# Patient Record
Sex: Male | Born: 1955 | Race: Black or African American | Hispanic: No | Marital: Married | Smoking: Former smoker
Health system: Southern US, Community
[De-identification: ages and names within clinical notes are randomized; demographics above are authoritative.]

## PROBLEM LIST (undated history)

## (undated) DIAGNOSIS — I10 Essential (primary) hypertension: Secondary | ICD-10-CM

## (undated) DIAGNOSIS — C801 Malignant (primary) neoplasm, unspecified: Secondary | ICD-10-CM

---

## 2014-08-31 DIAGNOSIS — I10 Essential (primary) hypertension: Secondary | ICD-10-CM | POA: Diagnosis present

## 2014-09-15 DIAGNOSIS — C8330 Diffuse large B-cell lymphoma, unspecified site: Secondary | ICD-10-CM | POA: Diagnosis present

## 2015-08-23 DIAGNOSIS — Z8619 Personal history of other infectious and parasitic diseases: Secondary | ICD-10-CM | POA: Insufficient documentation

## 2015-08-31 ENCOUNTER — Other Ambulatory Visit (HOSPITAL_COMMUNITY): Payer: Self-pay | Admitting: Gastroenterology

## 2015-08-31 DIAGNOSIS — B182 Chronic viral hepatitis C: Secondary | ICD-10-CM

## 2015-10-10 ENCOUNTER — Ambulatory Visit (HOSPITAL_COMMUNITY): Payer: Self-pay

## 2015-10-12 ENCOUNTER — Ambulatory Visit (HOSPITAL_COMMUNITY)
Admission: RE | Admit: 2015-10-12 | Discharge: 2015-10-12 | Disposition: A | Discharge status: Home / Self Care | Payer: 59 | Source: Ambulatory Visit | Attending: Gastroenterology | Admitting: Gastroenterology

## 2015-10-12 DIAGNOSIS — B182 Chronic viral hepatitis C: Secondary | ICD-10-CM | POA: Insufficient documentation

## 2019-03-11 ENCOUNTER — Encounter (HOSPITAL_BASED_OUTPATIENT_CLINIC_OR_DEPARTMENT_OTHER): Payer: Self-pay | Admitting: Emergency Medicine

## 2019-03-11 ENCOUNTER — Other Ambulatory Visit: Payer: Self-pay

## 2019-03-11 ENCOUNTER — Emergency Department (HOSPITAL_BASED_OUTPATIENT_CLINIC_OR_DEPARTMENT_OTHER): Payer: Medicare Other

## 2019-03-11 ENCOUNTER — Emergency Department (HOSPITAL_BASED_OUTPATIENT_CLINIC_OR_DEPARTMENT_OTHER)
Admission: EM | Admit: 2019-03-11 | Discharge: 2019-03-11 | Disposition: A | Discharge status: Home / Self Care | Payer: Medicare Other | Attending: Emergency Medicine | Admitting: Emergency Medicine

## 2019-03-11 DIAGNOSIS — I10 Essential (primary) hypertension: Secondary | ICD-10-CM | POA: Insufficient documentation

## 2019-03-11 DIAGNOSIS — F17210 Nicotine dependence, cigarettes, uncomplicated: Secondary | ICD-10-CM | POA: Diagnosis not present

## 2019-03-11 DIAGNOSIS — K279 Peptic ulcer, site unspecified, unspecified as acute or chronic, without hemorrhage or perforation: Secondary | ICD-10-CM | POA: Insufficient documentation

## 2019-03-11 DIAGNOSIS — R1012 Left upper quadrant pain: Secondary | ICD-10-CM | POA: Diagnosis present

## 2019-03-11 HISTORY — DX: Essential (primary) hypertension: I10

## 2019-03-11 HISTORY — DX: Malignant (primary) neoplasm, unspecified: C80.1

## 2019-03-11 LAB — COMPREHENSIVE METABOLIC PANEL
ALT: 16 U/L (ref 0–44)
AST: 20 U/L (ref 15–41)
Albumin: 4.1 g/dL (ref 3.5–5.0)
Alkaline Phosphatase: 53 U/L (ref 38–126)
Anion gap: 10 (ref 5–15)
BUN: 14 mg/dL (ref 8–23)
CO2: 25 mmol/L (ref 22–32)
Calcium: 9.4 mg/dL (ref 8.9–10.3)
Chloride: 103 mmol/L (ref 98–111)
Creatinine, Ser: 1.19 mg/dL (ref 0.61–1.24)
GFR calc Af Amer: >60 mL/min (ref >60)
GFR calc non Af Amer: >60 mL/min (ref >60)
Glucose, Bld: 105 mg/dL — ABNORMAL HIGH (ref 70–99)
Potassium: 3.5 mmol/L (ref 3.5–5.1)
Sodium: 138 mmol/L (ref 135–145)
Total Bilirubin: 0.4 mg/dL (ref 0.3–1.2)
Total Protein: 7.9 g/dL (ref 6.5–8.1)

## 2019-03-11 LAB — URINALYSIS, ROUTINE W REFLEX MICROSCOPIC
Bilirubin Urine: NEGATIVE
Glucose, UA: NEGATIVE mg/dL
Ketones, ur: 15 mg/dL — AB
Leukocytes,Ua: NEGATIVE
Nitrite: NEGATIVE
Protein, ur: NEGATIVE mg/dL
Specific Gravity, Urine: 1.025 (ref 1.005–1.030)
pH: 5.5 (ref 5.0–8.0)

## 2019-03-11 LAB — CBC WITH DIFFERENTIAL/PLATELET
Abs Immature Granulocytes: 0.03 10*3/uL (ref 0.00–0.07)
Basophils Absolute: 0 10*3/uL (ref 0.0–0.1)
Basophils Relative: 0 %
Eosinophils Absolute: 0.1 10*3/uL (ref 0.0–0.5)
Eosinophils Relative: 1 %
HCT: 42.3 % (ref 39.0–52.0)
Hemoglobin: 14 g/dL (ref 13.0–17.0)
Immature Granulocytes: 0 %
Lymphocytes Relative: 25 %
Lymphs Abs: 3.2 10*3/uL (ref 0.7–4.0)
MCH: 31.3 pg (ref 26.0–34.0)
MCHC: 33.1 g/dL (ref 30.0–36.0)
MCV: 94.6 fL (ref 80.0–100.0)
Monocytes Absolute: 1 10*3/uL (ref 0.1–1.0)
Monocytes Relative: 8 %
Neutro Abs: 8.5 10*3/uL — ABNORMAL HIGH (ref 1.7–7.7)
Neutrophils Relative %: 66 %
Platelets: 297 10*3/uL (ref 150–400)
RBC: 4.47 MIL/uL (ref 4.22–5.81)
RDW: 15.3 % (ref 11.5–15.5)
WBC: 12.9 10*3/uL — ABNORMAL HIGH (ref 4.0–10.5)
nRBC: 0 % (ref 0.0–0.2)

## 2019-03-11 LAB — URINALYSIS, MICROSCOPIC (REFLEX): WBC, UA: NONE SEEN WBC/hpf (ref 0–5)

## 2019-03-11 LAB — LIPASE, BLOOD: Lipase: 24 U/L (ref 11–51)

## 2019-03-11 MED ORDER — SUCRALFATE 1 G PO TABS
1.0000 g | ORAL_TABLET | Freq: Once | ORAL | Status: AC
  Administered 2019-03-11: 1 g via ORAL
  Filled 2019-03-11: qty 1

## 2019-03-11 MED ORDER — MORPHINE SULFATE (PF) 4 MG/ML IV SOLN
4.0000 mg | Freq: Once | INTRAVENOUS | Status: AC
  Administered 2019-03-11: 4 mg via INTRAVENOUS
  Filled 2019-03-11: qty 1

## 2019-03-11 MED ORDER — LIDOCAINE VISCOUS HCL 2 % MT SOLN
15.0000 mL | Freq: Once | OROMUCOSAL | Status: AC
  Administered 2019-03-11: 15 mL via ORAL
  Filled 2019-03-11: qty 15

## 2019-03-11 MED ORDER — PANTOPRAZOLE SODIUM 40 MG IV SOLR
40.0000 mg | Freq: Once | INTRAVENOUS | Status: AC
  Administered 2019-03-11: 40 mg via INTRAVENOUS
  Filled 2019-03-11: qty 40

## 2019-03-11 MED ORDER — SUCRALFATE 1 GM/10ML PO SUSP
ORAL | Status: AC
  Administered 2019-03-11: 1 g
  Filled 2019-03-11: qty 10

## 2019-03-11 MED ORDER — PANTOPRAZOLE SODIUM 20 MG PO TBEC: 20.0000 mg | DELAYED_RELEASE_TABLET | Freq: Every day | ORAL | 0 refills | Status: DC

## 2019-03-11 MED ORDER — FAMOTIDINE IN NACL 20-0.9 MG/50ML-% IV SOLN
20.0000 mg | Freq: Once | INTRAVENOUS | Status: AC
  Administered 2019-03-11: 19:00:00 20 mg via INTRAVENOUS
  Filled 2019-03-11: qty 50

## 2019-03-11 MED ORDER — ALUM & MAG HYDROXIDE-SIMETH 200-200-20 MG/5ML PO SUSP
30.0000 mL | Freq: Once | ORAL | Status: AC
  Administered 2019-03-11: 30 mL via ORAL
  Filled 2019-03-11: qty 30

## 2019-03-11 MED ORDER — IOHEXOL 300 MG/ML  SOLN
100.0000 mL | Freq: Once | INTRAMUSCULAR | Status: AC | PRN
  Administered 2019-03-11: 100 mL via INTRAVENOUS

## 2019-03-11 MED ORDER — FAMOTIDINE 20 MG PO TABS: 20.0000 mg | ORAL_TABLET | Freq: Two times a day (BID) | ORAL | 0 refills | Status: DC

## 2019-03-11 MED ORDER — SODIUM CHLORIDE 0.9 % IV BOLUS
500.0000 mL | Freq: Once | INTRAVENOUS | Status: AC
  Administered 2019-03-11: 500 mL via INTRAVENOUS

## 2019-03-11 NOTE — ED Provider Notes (Addendum)
[facility] Provider Note   CSN: [id number] Arrival date & time: 03/11/19  1550     History Chief Complaint  Patient presents with   Abdominal Pain    Terry Beltran is a 64 y.o. male with history of lymphoma in remission since 2017, hypertension otherwise healthy no daily medication use.  Patient presents today for left upper quadrant pain onset 3 weeks ago pain is intermittent worsened in the afternoon and night, nonradiating, burning in nature moderate in intensity, no alleviating factors.  He has not attempted any medication for his pain prior to arrival.  Patient reports that he had a CT scan 3 weeks ago which showed "diverticulitis".  He reports he has not started any medications after that visit.  He denies fever/chills, headache, neck pain, chest pain/shortness of breath, cough/hemoptysis, vomiting, diarrhea, dysuria/hematuria, testicular pain/swelling, fall/injury or any additional concerns. HPI     Past Medical History:  Diagnosis Date   Cancer ([place])    Hypertension     There are no problems to display for this patient.   History reviewed. No pertinent surgical history.     No family history on file.  Social History   Tobacco Use   Smoking status: Current Some Day Smoker   Smokeless tobacco: Never Used  Substance Use Topics   Alcohol use: Yes   Drug use: Never    Home Medications Prior to Admission medications   Medication Sig Start Date End Date Taking? Authorizing Provider  famotidine (PEPCID) 20 MG tablet Take 1 tablet (20 mg total) by mouth 2 (two) times daily. 03/11/19 04/10/19  Nuala Alpha A, PA-C  pantoprazole (PROTONIX) 20 MG tablet Take 1 tablet (20 mg total) by mouth daily. 03/11/19 04/10/19  Deliah Boston, PA-C    Allergies    Patient has no known allergies.  Review of Systems   Review of Systems.  Ten systems are reviewed and are negative for acute change except as noted in the  HPI  Physical Exam Updated Vital Signs BP (!) 148/84 (BP Location: Left Arm)    Pulse 84    Temp 99 F (37.2 C) (Oral)    Resp 20    Ht 6' (1.829 m)    Wt 114.3 kg    SpO2 99%    BMI 34.18 kg/m   Physical Exam Constitutional:      General: He is not in acute distress.    Appearance: Normal appearance. He is well-developed. He is not ill-appearing or diaphoretic.  HENT:     Head: Normocephalic and atraumatic.     Right Ear: External ear normal.     Left Ear: External ear normal.     Nose: Nose normal.  Eyes:     General: Vision grossly intact. Gaze aligned appropriately.     Pupils: Pupils are equal, round, and reactive to light.  Neck:     Trachea: Trachea and phonation normal. No tracheal deviation.  Cardiovascular:     Rate and Rhythm: Normal rate and regular rhythm.     Heart sounds: Normal heart sounds.  Pulmonary:     Effort: Pulmonary effort is normal. No respiratory distress.     Breath sounds: Normal breath sounds.  Abdominal:     General: There is no distension.     Palpations: Abdomen is soft.     Tenderness: There is abdominal tenderness in the left upper quadrant. There is no guarding or rebound.  Genitourinary:    Comments: Deferred by patient Musculoskeletal:  General: Normal range of motion.     Cervical back: Normal range of motion.  Skin:    General: Skin is warm and dry.  Neurological:     Mental Status: He is alert.     GCS: GCS eye subscore is 4. GCS verbal subscore is 5. GCS motor subscore is 6.     Comments: Speech is clear and goal oriented, follows commands Major Cranial nerves without deficit, no facial droop Moves extremities without ataxia, coordination intact  Psychiatric:        Behavior: Behavior normal.     ED Results / Procedures / Treatments   Labs (all labs ordered are listed, but only abnormal results are displayed) Labs Reviewed  URINALYSIS, ROUTINE W REFLEX MICROSCOPIC - Abnormal; Notable for the following components:       Result Value   Hgb urine dipstick TRACE (*)    Ketones, ur 15 (*)    All other components within normal limits  COMPREHENSIVE METABOLIC PANEL - Abnormal; Notable for the following components:   Glucose, Bld 105 (*)    All other components within normal limits  CBC WITH DIFFERENTIAL/PLATELET - Abnormal; Notable for the following components:   WBC 12.9 (*)    Neutro Abs 8.5 (*)    All other components within normal limits  URINALYSIS, MICROSCOPIC (REFLEX) - Abnormal; Notable for the following components:   Bacteria, UA RARE (*)    All other components within normal limits  LIPASE, BLOOD    EKG EKG Interpretation  Date/Time:  Thursday March 11 2019 16:33:47 EST Ventricular Rate:  74 PR Interval:  286 QRS Duration: 82 QT Interval:  420 QTC Calculation: 466 R Axis:   22 Text Interpretation: Sinus rhythm with 1st degree A-V block Nonspecific T wave abnormality Prolonged QT Abnormal ECG No old tracing to compare Confirmed by Sherwood Gambler [phone]) on 03/11/2019 6:00:14 PM   Radiology CT ABDOMEN PELVIS W CONTRAST  Result Date: 03/11/2019 CLINICAL DATA:  Diverticulitis. Left upper quadrant abdominal pain. EXAM: CT ABDOMEN AND PELVIS WITH CONTRAST TECHNIQUE: Multidetector CT imaging of the abdomen and pelvis was performed using the standard protocol following bolus administration of intravenous contrast. CONTRAST:  158mL OMNIPAQUE IOHEXOL 300 MG/ML  SOLN COMPARISON:  09/01/2014. FINDINGS: Lower chest: The lung bases are clear. The heart size is normal. Hepatobiliary: The liver is normal. Normal gallbladder.There is no biliary ductal dilation. Pancreas: Normal contours without ductal dilatation. No peripancreatic fluid collection. Spleen: No splenic laceration or hematoma. Adrenals/Urinary Tract: --Adrenal glands: No adrenal hemorrhage. --Right kidney/ureter: No hydronephrosis or perinephric hematoma. --Left kidney/ureter: No hydronephrosis or perinephric hematoma. --Urinary bladder:  Unremarkable. Stomach/Bowel: --Stomach/Duodenum: There is some wall thickening of the gastric antrum, pylorus, and proximal duodenum. There is mild adjacent fat stranding with a few prominent regional lymph nodes. --Small bowel: No dilatation or inflammation. --Colon: There are few scattered colonic diverticula without CT evidence for diverticulitis. --Appendix: Normal. Vascular/Lymphatic: Atherosclerotic calcification is present within the non-aneurysmal abdominal aorta, without hemodynamically significant stenosis. --No retroperitoneal lymphadenopathy. --No mesenteric lymphadenopathy. --No pelvic or inguinal lymphadenopathy. Reproductive: Unremarkable Other: No ascites or free air. The abdominal wall is normal. Musculoskeletal. No acute displaced fractures. IMPRESSION: 1. There is some wall thickening of the gastric antrum, pylorus, and proximal duodenum with mild adjacent fat stranding with a few prominent regional lymph nodes. Findings are suggestive of gastritis/duodenitis or peptic ulcer disease. 2. Colonic diverticulosis without diverticulitis. 3. Aortic Atherosclerosis (ICD10-I70.0). Electronically Signed   By: Constance Holster M.D.   On: 03/11/2019 17:54  Procedures Procedures (including critical care time)  Medications Ordered in ED Medications  alum & mag hydroxide-simeth (MAALOX/MYLANTA) 200-200-20 MG/5ML suspension 30 mL (30 mLs Oral Given 03/11/19 1644)    And  lidocaine (XYLOCAINE) 2 % viscous mouth solution 15 mL (15 mLs Oral Given 03/11/19 1645)  sodium chloride 0.9 % bolus 500 mL (0 mLs Intravenous Stopped 03/11/19 1746)  iohexol (OMNIPAQUE) 300 MG/ML solution 100 mL (100 mLs Intravenous Contrast Given 03/11/19 1719)  morphine 4 MG/ML injection 4 mg (4 mg Intravenous Given 03/11/19 1751)  sucralfate (CARAFATE) tablet 1 g (1 g Oral Given 03/11/19 1825)  sucralfate (CARAFATE) 1 GM/10ML suspension (1 g  Given 03/11/19 1824)  pantoprazole (PROTONIX) injection 40 mg (40 mg Intravenous Given  03/11/19 1845)  famotidine (PEPCID) IVPB 20 mg premix (0 mg Intravenous Stopped 03/11/19 1854)    ED Course  I have reviewed the triage vital signs and the nursing notes.  Pertinent labs & imaging results that were available during my care of the patient were reviewed by me and considered in my medical decision making (see chart for details).    MDM Rules/Calculators/A&P                     63 year old male with history as detailed above presents today for left upper quadrant pain that has been ongoing for the past 3 weeks it is waxing and waning worsened in the afternoon and nighttime, has not attempted any medication prior to arrival for his symptoms.  He denies any infectious-like symptoms nausea, vomiting, diarrhea and denies chest pain shortness of breath or cough.  He is mildly tender in the left upper quadrant without peritoneal signs.  No sign of injury.  Will obtain abdominal lab work and CT abdomen pelvis, patient reports a history of diverticulitis diagnosed 3 weeks ago at [facility] but was not started on any antibiotics.  I am unable to access their images through chart review he cannot review that visit.  My suspicion for diverticulitis is low at this time however patient reports that he was diagnosed diverticulitis so feel CT scan is needed at this time, will treat for likely reflux symptoms. - CBC shows mild excesses of 12.9 with left shift, likely secondary to patient's pain Lipase within normal limits no evidence of pancreatitis CMP with glucose 105 otherwise within normal limits no acute electrolyte abnormalities, kidney injury or elevation of LFTs Urinalysis shows trace hemoglobin 15 ketones possibly secondary to dehydration no evidence of infection  EKG: Sinus rhythm with 1st degree A-V block Nonspecific T wave abnormality Prolonged QT Abnormal ECG No old tracing to compare Confirmed by Sherwood Gambler [phone]) on 03/11/2019 6:00:14 PM Patient without chest pain or  shortness of breath, no evidence of acute ischemic changes.  Patient will follow up with his family doctor for incidental findings on EKG.  Discussed with Dr. Regenia Skeeter who agrees.  CT AP:  IMPRESSION:  1. There is some wall thickening of the gastric antrum, pylorus, and  proximal duodenum with mild adjacent fat stranding with a few  prominent regional lymph nodes. Findings are suggestive of  gastritis/duodenitis or peptic ulcer disease.  2. Colonic diverticulosis without diverticulitis.  3. Aortic Atherosclerosis (ICD10-I70.0).  - Suspect patient symptoms today secondary to likely PUD, he has no evidence of perforation today.  He was given GI cocktail, Carafate and morphine.  Pain resolved.  He was started on Protonix and Pepcid.  He will be continued on PPI and H2 blocker and referred  to gastroenterology for follow-up visit.  On reexamination he is well-appearing no acute distress.  He states understanding of findings as above and has no further questions. - 7:06 PM: Again reevaluated resting comfortably no acute distress he has received Pepcid and Protonix and is requesting discharge.  I discussed recommendations including to stop using NSAIDs and alcohol and he states understanding.  He plans to call gastroenterologist tomorrow.  At this time there does not appear to be any evidence of an acute emergency medical condition and the patient appears stable for discharge with appropriate outpatient follow up. Diagnosis was discussed with patient who verbalizes understanding of care plan and is agreeable to discharge. I have discussed return precautions with patient who verbalizes understanding of return precautions. Patient encouraged to follow-up with their PCP and GI. All questions answered.  Patient's seen and evaluated by Dr. Regenia Skeeter during this visit who agrees with discharge and outpatient follow-up.  Note: Portions of this report may have been transcribed using voice recognition software.  Every effort was made to ensure accuracy; however, inadvertent computerized transcription errors may still be present. Final Clinical Impression(s) / ED Diagnoses Final diagnoses:  PUD (peptic ulcer disease)  Left upper quadrant abdominal pain    Rx / DC Orders ED Discharge Orders         Ordered    pantoprazole (PROTONIX) 20 MG tablet  Daily     03/11/19 1901    famotidine (PEPCID) 20 MG tablet  2 times daily     03/11/19 1901           Gari Crown 03/11/19 1904    Gari Crown 03/11/19 Glenetta Hew, MD 03/12/19 [phone]

## 2019-03-11 NOTE — ED Triage Notes (Signed)
LUQ pain x 3 weeks, worse in the afternoon. Has had a CT scan which was neg.

## 2019-03-11 NOTE — Discharge Instructions (Addendum)
You have been diagnosed today with left upper quadrant pain.  At this time there does not appear to be the presence of an emergent medical condition, however there is always the potential for conditions to change. Please read and follow the below instructions.  Please return to the Emergency Department immediately for any new or worsening symptoms. Please be sure to follow up with your Primary Care Provider within one week regarding your visit today; please call their office to schedule an appointment even if you are feeling better for a follow-up visit. Please take the medications Protonix and Pepcid as prescribed to help with your abdominal pain.  Please call the gastroenterologist Dr. Collene Mares on your discharge paperwork to schedule a follow-up visit for further evaluation of your abdominal pain. Your CT scan today showed diverticulosis and aortic atherosclerosis, please discuss these incidental findings with your primary care provider and the gastroenterologist at your follow-up visits. Your EKG today showed first-degree AV block and nonspecific T wave abnormalities and prolonged QT.  Please discuss these incidental findings with your primary care provider at your follow-up visit this week.  Get help right away if: You have sudden, sharp pain in your belly (abdomen). You have belly pain that does not go away. You have bloody poop (stool) or black, tarry poop. You throw up blood. It may look like coffee grounds. You feel light-headed or feel like you may pass out (faint). You get weak. You get sweaty or feel sticky and cold to the touch (clammy). You have any new/concerning or worsening of symptoms  Please read the additional information packets attached to your discharge summary.  Do not take your medicine if  develop an itchy rash, swelling in your mouth or lips, or difficulty breathing; call 911 and seek immediate emergency medical attention if this occurs.  Note: Portions of this text may  have been transcribed using voice recognition software. Every effort was made to ensure accuracy; however, inadvertent computerized transcription errors may still be present.

## 2019-07-12 DIAGNOSIS — E782 Mixed hyperlipidemia: Secondary | ICD-10-CM | POA: Diagnosis present

## 2021-02-11 ENCOUNTER — Other Ambulatory Visit: Payer: Self-pay

## 2021-02-11 ENCOUNTER — Encounter (HOSPITAL_BASED_OUTPATIENT_CLINIC_OR_DEPARTMENT_OTHER): Payer: Self-pay

## 2021-02-11 ENCOUNTER — Emergency Department (HOSPITAL_BASED_OUTPATIENT_CLINIC_OR_DEPARTMENT_OTHER)
Admission: EM | Admit: 2021-02-11 | Discharge: 2021-02-11 | Disposition: A | Discharge status: Home / Self Care | Payer: Medicare Other | Attending: Emergency Medicine | Admitting: Emergency Medicine

## 2021-02-11 ENCOUNTER — Emergency Department (HOSPITAL_BASED_OUTPATIENT_CLINIC_OR_DEPARTMENT_OTHER): Payer: Medicare Other

## 2021-02-11 DIAGNOSIS — R1032 Left lower quadrant pain: Secondary | ICD-10-CM | POA: Diagnosis present

## 2021-02-11 DIAGNOSIS — I10 Essential (primary) hypertension: Secondary | ICD-10-CM | POA: Insufficient documentation

## 2021-02-11 DIAGNOSIS — R112 Nausea with vomiting, unspecified: Secondary | ICD-10-CM | POA: Diagnosis not present

## 2021-02-11 DIAGNOSIS — Z20822 Contact with and (suspected) exposure to covid-19: Secondary | ICD-10-CM | POA: Insufficient documentation

## 2021-02-11 DIAGNOSIS — R0602 Shortness of breath: Secondary | ICD-10-CM | POA: Insufficient documentation

## 2021-02-11 LAB — CBC
HCT: 45.7 % (ref 39.0–52.0)
Hemoglobin: 15.4 g/dL (ref 13.0–17.0)
MCH: 31.7 pg (ref 26.0–34.0)
MCHC: 33.7 g/dL (ref 30.0–36.0)
MCV: 94 fL (ref 80.0–100.0)
Platelets: 336 10*3/uL (ref 150–400)
RBC: 4.86 MIL/uL (ref 4.22–5.81)
RDW: 14 % (ref 11.5–15.5)
WBC: 14.6 10*3/uL — ABNORMAL HIGH (ref 4.0–10.5)
nRBC: 0 % (ref 0.0–0.2)

## 2021-02-11 LAB — COMPREHENSIVE METABOLIC PANEL
ALT: 35 U/L (ref 0–44)
AST: 32 U/L (ref 15–41)
Albumin: 4.3 g/dL (ref 3.5–5.0)
Alkaline Phosphatase: 68 U/L (ref 38–126)
Anion gap: 10 (ref 5–15)
BUN: 17 mg/dL (ref 8–23)
CO2: 27 mmol/L (ref 22–32)
Calcium: 10.3 mg/dL (ref 8.9–10.3)
Chloride: 99 mmol/L (ref 98–111)
Creatinine, Ser: 1.17 mg/dL (ref 0.61–1.24)
GFR, Estimated: >60 mL/min (ref >60)
Glucose, Bld: 119 mg/dL — ABNORMAL HIGH (ref 70–99)
Potassium: 3.5 mmol/L (ref 3.5–5.1)
Sodium: 136 mmol/L (ref 135–145)
Total Bilirubin: 0.9 mg/dL (ref 0.3–1.2)
Total Protein: 8.5 g/dL — ABNORMAL HIGH (ref 6.5–8.1)

## 2021-02-11 LAB — RESP PANEL BY RT-PCR (FLU A&B, COVID) ARPGX2
Influenza A by PCR: NEGATIVE
Influenza B by PCR: NEGATIVE
SARS Coronavirus 2 by RT PCR: NEGATIVE

## 2021-02-11 LAB — LIPASE, BLOOD: Lipase: 32 U/L (ref 11–51)

## 2021-02-11 LAB — TROPONIN I (HIGH SENSITIVITY)
Troponin I (High Sensitivity): 21 ng/L — ABNORMAL HIGH (ref <18)
Troponin I (High Sensitivity): 26 ng/L — ABNORMAL HIGH (ref <18)

## 2021-02-11 MED ORDER — PANTOPRAZOLE SODIUM 20 MG PO TBEC
20.0000 mg | DELAYED_RELEASE_TABLET | Freq: Once | ORAL | Status: DC
Start: 2021-02-11 — End: 2021-02-11

## 2021-02-11 MED ORDER — PANTOPRAZOLE SODIUM 20 MG PO TBEC: 20.0000 mg | DELAYED_RELEASE_TABLET | Freq: Every day | ORAL | 0 refills | Status: DC

## 2021-02-11 MED ORDER — SODIUM CHLORIDE 0.9 % IV BOLUS
1000.0000 mL | Freq: Once | INTRAVENOUS | Status: AC
  Administered 2021-02-11: 1000 mL via INTRAVENOUS

## 2021-02-11 MED ORDER — SUCRALFATE 1 G PO TABS
1.0000 g | ORAL_TABLET | Freq: Three times a day (TID) | ORAL | 0 refills | Status: DC
Start: 2021-02-11 — End: 2022-02-21

## 2021-02-11 MED ORDER — IOHEXOL 300 MG/ML  SOLN
100.0000 mL | Freq: Once | INTRAMUSCULAR | Status: AC | PRN
  Administered 2021-02-11: 100 mL via INTRAVENOUS

## 2021-02-11 MED ORDER — MORPHINE SULFATE (PF) 4 MG/ML IV SOLN
4.0000 mg | Freq: Once | INTRAVENOUS | Status: AC
  Administered 2021-02-11: 4 mg via INTRAVENOUS
  Filled 2021-02-11: qty 1

## 2021-02-11 MED ORDER — SUCRALFATE 1 G PO TABS: 1.0000 g | ORAL_TABLET | Freq: Three times a day (TID) | ORAL | 0 refills | Status: DC

## 2021-02-11 NOTE — ED Notes (Signed)
Patient transported to CT 

## 2021-02-11 NOTE — ED Notes (Signed)
Attempted IV x 2, to Bil AC's. Tol well, blood obtained for labs

## 2021-02-11 NOTE — Discharge Instructions (Addendum)
He was seen in the emergency department today for abdominal pain.  He had a CT of your abdomen which showed that you had some irritation of your stomach.  We are getting give you 2 medications.  1 is Carafate that you will take 4 times a day.  The other is Protonix that you will take once a day.  Please follow-up with your primary care provider.  Please return to the emergency department if you have chest pain, worsening shortness of breath, worsening abdominal pain, distention, nausea or vomiting that does not respond.  Unable to eat or drink.

## 2021-02-11 NOTE — ED Provider Notes (Signed)
Shared visit.  Patient comes to the ED with abdominal pain.  Normal vitals.  No fever.  History of diffuse B cell lymphoma in remission.  Also history of hypertension.  History of gastritis and peptic ulcer disease.  Patient has been having left lower abdominal pressure and discomfort for about a week with nausea and vomiting and decreased appetite.  Denies any chest pain.  Had some shortness of breath earlier today but none currently.  Patient denies any black or bloody stools.  Denies any constipation.  He has not had any exertional chest pain or shortness of breath.  Concern for possible diverticulitis per patient.  EKG done in triage shows sinus rhythm with right and left bundle branch.  No ischemic changes.  Differential diagnosis includes diverticulitis versus gastritis versus bowel obstruction.  He is not having any urinary symptoms.  Left-sided pressure he describes and will get troponin to also evaluate for acute coronary syndrome.  Although this is thought to be less likely due to atypical story.  He is not having any active shortness of breath.  No hypoxia or tachycardia and have no suspicion for pulmonary embolism.  He does have a smoking history.  Do not hear any wheezing.  Will obtain a chest x-ray to evaluate for pneumonia.  Per my interpretation chest x-ray has no evidence of pneumonia or pneumothorax.  CT scan per radiology read shows wall thickening of the stomach suggestive of gastritis.  Otherwise there is no diverticulitis or bowel obstruction or other acute intra-abdominal process.  Patient had troponin of 21 and 26.  Overall no concern for acute coronary syndrome.  No chest pain or shortness of breath.  Patient had no significant leukocytosis, anemia, electrolyte abnormality.  COVID and flu test were negative.  Lipase normal.  Overall suspect gastritis causing the symptoms/gas related pain.  Will treat with Protonix and Carafate.  Told to return to the ED if he develops worsening pain  including worsening chest pain/shortness of breath.  Discharged in good condition.  Recommend follow-up with primary care doctor.  This chart was dictated using voice recognition software.  Despite best efforts to proofread,  errors can occur which can change the documentation meaning.    Lennice Sites, DO 02/11/21 1419

## 2021-02-11 NOTE — ED Notes (Signed)
Pt states unable to provide urine specimen at this time, given water and getting IV fluids

## 2021-02-11 NOTE — ED Triage Notes (Signed)
Pt c/o left abdominal pressure x 1 week with nausea, vomiting & decreased appetite. Last BM this morning. Shortness of breath with ambulation.

## 2021-02-11 NOTE — ED Provider Notes (Signed)
[facility] Provider Note   CSN: [id number] Arrival date & time: 02/11/21  1048     History  Chief Complaint  Patient presents with   Abdominal Pain    Terry Beltran is a 66 y.o. male.  With past medical history of hypertension, DLBCL now in remission who presents to the emergency department with abdominal pain.  Patient states that for 1 week he has had dull left lower quadrant abdominal pain that he described as intermittent and pressure.  He has had associated nausea and vomiting, last episode yesterday.  It is nonbloody vomit.  He has had normal bowel movements, the last one being this morning.  He denies any dark tarry stools or hematochezia.  He denies any fevers, decreased appetite, urinary symptoms, testicular pain or swelling, weight loss, feeling bloated, excess gas or indigestion.  Additionally he states that this morning coming to the emergency department he felt increasingly short of breath.  He denies any chest pain, palpitations, lower extremity edema, recent travel, unilateral lower extremity swelling, history of DVT or PE.  He does smoke.   Abdominal Pain Associated symptoms: nausea, shortness of breath and vomiting   Associated symptoms: no chest pain, no constipation, no cough, no diarrhea, no dysuria and no fever       Home Medications Prior to Admission medications   Medication Sig Start Date End Date Taking? Authorizing Provider  famotidine (PEPCID) 20 MG tablet Take 1 tablet (20 mg total) by mouth 2 (two) times daily. 03/11/19 04/10/19  Nuala Alpha A, PA-C  pantoprazole (PROTONIX) 20 MG tablet Take 1 tablet (20 mg total) by mouth daily. 03/11/19 04/10/19  Deliah Boston, PA-C      Allergies    Patient has no known allergies.    Review of Systems   Review of Systems  Constitutional:  Negative for fever.  Respiratory:  Positive for shortness of breath. Negative for cough.   Cardiovascular:  Negative for chest pain,  palpitations and leg swelling.  Gastrointestinal:  Positive for abdominal pain, nausea and vomiting. Negative for abdominal distention, constipation and diarrhea.  Genitourinary:  Negative for dysuria and testicular pain.  All other systems reviewed and are negative.  Physical Exam Updated Vital Signs BP (!) 155/102 (BP Location: Left Arm)    Pulse 94    Temp 98.2 F (36.8 C) (Oral)    Resp 18    Ht 6' (1.829 m)    Wt 114.8 kg    SpO2 98%    BMI 34.33 kg/m  Physical Exam Vitals and nursing note reviewed.  Constitutional:      General: He is not in acute distress.    Appearance: Normal appearance. He is well-developed. He is not ill-appearing or toxic-appearing.  HENT:     Head: Normocephalic and atraumatic.     Mouth/Throat:     Mouth: Mucous membranes are moist.     Pharynx: Oropharynx is clear.  Eyes:     General: No scleral icterus.    Extraocular Movements: Extraocular movements intact.     Pupils: Pupils are equal, round, and reactive to light.  Cardiovascular:     Rate and Rhythm: Normal rate and regular rhythm.     Heart sounds: Normal heart sounds. No murmur heard. Pulmonary:     Effort: Pulmonary effort is normal. No respiratory distress.     Breath sounds: Normal breath sounds.  Abdominal:     General: Abdomen is protuberant. Bowel sounds are normal. There is no distension.  Palpations: Abdomen is soft.     Tenderness: There is abdominal tenderness in the left lower quadrant. There is no right CVA tenderness or left CVA tenderness. Negative signs include Murphy's sign.     Hernia: No hernia is present.  Musculoskeletal:        General: Normal range of motion.     Cervical back: Normal range of motion.  Skin:    General: Skin is warm and dry.     Capillary Refill: Capillary refill takes less than 2 seconds.  Neurological:     General: No focal deficit present.     Mental Status: He is alert and oriented to person, place, and time. Mental status is at baseline.   Psychiatric:        Mood and Affect: Mood normal.        Behavior: Behavior normal.        Thought Content: Thought content normal.        Judgment: Judgment normal.    ED Results / Procedures / Treatments   Labs (all labs ordered are listed, but only abnormal results are displayed) Labs Reviewed  CBC - Abnormal; Notable for the following components:      Result Value   WBC 14.6 (*)    All other components within normal limits  COMPREHENSIVE METABOLIC PANEL - Abnormal; Notable for the following components:   Glucose, Bld 119 (*)    Total Protein 8.5 (*)    All other components within normal limits  TROPONIN I (HIGH SENSITIVITY) - Abnormal; Notable for the following components:   Troponin I (High Sensitivity) 21 (*)    All other components within normal limits  TROPONIN I (HIGH SENSITIVITY) - Abnormal; Notable for the following components:   Troponin I (High Sensitivity) 26 (*)    All other components within normal limits  RESP PANEL BY RT-PCR (FLU A&B, COVID) ARPGX2  LIPASE, BLOOD  URINALYSIS, ROUTINE W REFLEX MICROSCOPIC   EKG EKG Interpretation  Date/Time:  Sunday February 11 2021 13:30:16 EST Ventricular Rate:  80 PR Interval:  269 QRS Duration: 141 QT Interval:  415 QTC Calculation: 479 R Axis:   -29 Text Interpretation: Sinus rhythm Prolonged PR interval Right bundle branch block Confirmed by Lennice Sites ([id number]) on 02/11/2021 1:34:12 PM  Radiology DG Chest 2 View  Result Date: 02/11/2021 CLINICAL DATA:  Pt complains of abdominal pain on his left side. Sob and lightheadedness on exertion. Smoker. HTN. No known heart or lung conditions. Hx of cancer. EXAM: CHEST - 2 VIEW COMPARISON:  07/08/2016. FINDINGS: Cardiac silhouette is normal in size. No mediastinal or hilar masses. No evidence of adenopathy. Clear lungs.  No pleural effusion or pneumothorax. Skeletal structures are intact. IMPRESSION: No active cardiopulmonary disease. Electronically Signed   By: Lajean Manes  M.D.   On: 02/11/2021 11:41   CT Abdomen Pelvis W Contrast  Result Date: 02/11/2021 CLINICAL DATA:  Left abdominal pressure for 1 week with nausea and vomiting. Decreased appetite. Short of breath with ambulation. EXAM: CT ABDOMEN AND PELVIS WITH CONTRAST TECHNIQUE: Multidetector CT imaging of the abdomen and pelvis was performed using the standard protocol following bolus administration of intravenous contrast. RADIATION DOSE REDUCTION: This exam was performed according to the departmental dose-optimization program which includes automated exposure control, adjustment of the mA and/or kV according to patient size and/or use of iterative reconstruction technique. CONTRAST:  143mL OMNIPAQUE IOHEXOL 300 MG/ML  SOLN COMPARISON:  03/11/2019. FINDINGS: Lower chest: No acute abnormality. Hepatobiliary: No focal liver abnormality  is seen. No gallstones, gallbladder wall thickening, or biliary dilatation. Pancreas: Unremarkable. No pancreatic ductal dilatation or surrounding inflammatory changes. Spleen: Normal in size without focal abnormality. Adrenals/Urinary Tract: Mild stable nodular thickening of the left adrenal gland consistent with hyperplasia similar to the prior CT. Normal right adrenal gland. Kidneys normal size, orientation and position with symmetric enhancement and excretion. Subcentimeter low-density renal lesions, lateral cortex, lower pole of the right kidney and posterior upper pole the left kidney, stable consistent with cysts. No other masses, no stones and no hydronephrosis. Normal ureters. Normal bladder. Stomach/Bowel: Apparent thickening of the wall of the stomach, along its posterior margin and lesser sac. Stomach otherwise unremarkable. Colon and small bowel are normal in caliber. No wall thickening. No inflammation. Normal appendix visualized. Vascular/Lymphatic: Prominent periceliac lymph nodes, largest 1.1 cm short axis. No other adenopathy. Minor aortic atherosclerosis. No aneurysm.  Reproductive: Unremarkable. Other: Minimal fat containing umbilical hernia.  No ascites. Musculoskeletal: No fracture or acute finding.  No bone lesion. IMPRESSION: 1. Apparent wall thickening along the posterior margin of the stomach and lesser sac suggesting gastritis. This is an equivocal finding. There are prominent periceliac lymph nodes, presumed reactive and stable from the prior CT. 2. No other evidence of an acute abnormality within the abdomen or pelvis. No findings to account for left lower quadrant abdominal pain. 3. Minor aortic atherosclerosis. Electronically Signed   By: Lajean Manes M.D.   On: 02/11/2021 12:38    Procedures Procedures   Medications Ordered in ED Medications  morphine 4 MG/ML injection 4 mg (4 mg Intravenous Given 02/11/21 1206)  iohexol (OMNIPAQUE) 300 MG/ML solution 100 mL (100 mLs Intravenous Contrast Given 02/11/21 1210)  sodium chloride 0.9 % bolus 1,000 mL (0 mLs Intravenous Stopped 02/11/21 1419)   ED Course/ Medical Decision Making/ A&P                           Medical Decision Making Amount and/or Complexity of Data Reviewed Labs: ordered. Radiology: ordered.  Risk Prescription drug management.  Patient presents to the ED with complaints of abdominal pain. This involves an extensive number of treatment options, and is a complaint that carries with it a high risk of complications and morbidity.   Additional history obtained:  Additional history obtained from: External records from outside source obtained and reviewed including: Previous oncology and primary care visits  EKG: Sinus rhythm, right bundle branch block, prolonged PR interval  Cardiac Monitoring: The patient was maintained on a cardiac monitor.  I personally viewed and interpreted the cardiac monitored which showed an underlying rhythm of: Sinus rhythm  Lab Results: I Ordered, reviewed, and interpreted labs. Pertinent results include: CBC with leukocytosis to 14.6, likely  reactive CMP within normal limits Lipase 32 COVID and flu negative Initial troponin 21, repeat 26, delta +5  Imaging Studies ordered:  I ordered imaging studies which included chest x-ray, CT abdomen pelvis with contrast.  I independently reviewed & interpreted imaging & am in agreement with radiology impression. Imaging shows: Chest x-ray which demonstrates no active cardiopulmonary disease CT abdomen pelvis which shows apparent wall thickening along the posterior margin of the stomach and lesser sac suggesting gastritis.  No other abnormalities.  Medications  I ordered medication including IV fluids for nausea and vomiting, morphine for pain  Reevaluation of the patient after medication shows that patient improved  Tests Considered: D-dimer  ED Course: 66 year old male who presents emergency department with 1 week of left  lower quadrant abdominal pain associated with nausea and vomiting.  Additionally had an episode of shortness of breath when he presented to the emergency department which has since resolved. He does have leukocytosis to 14.6 which I think is likely reactive.  He has been afebrile, no tachycardia or other evidence of acute infections at this time.  He does have gastritis as seen on CT however there are no other acute abnormalities found on imaging.  Due to this, doubt acute diverticulitis, appendicitis, bowel obstruction, mesenteric ischemia. Lipase 32, doubt acute pancreatitis is a component of his symptoms.  His symptoms are not consistent with acute cholecystitis, cholangitis, cholelithiasis.  No urinary symptoms so doubt UTI as a component.  No genitourinary symptoms so doubt torsion. Due to the shortness of breath for the performed chest x-ray which was normal.  He did have troponin 21, 26, delta +5.  Discussed this with attending, Dr. Ronnald Nian who agrees with outpatient follow-up.  Patient has no chest pain, palpitations.  His shortness of breath is resolved from his  presentation.  Shortness of breath may be due to deconditioning. Otherwise the patient is well-appearing and nontoxic in appearance.  His pain was improved with morphine and fluids.  He does have evidence of gastritis which may be causing some of his pain symptoms.  We will place him on Carafate as well as Protonix and close outpatient follow-up with primary care.  He verbalized understanding.  I have answered all of his questions at bedside.  Dispostion:  After consideration of the diagnostic results and the patients response to treatment, I feel that the patent would benefit from discharge.  Considered admission however not necessary at this time. The patient has been appropriately medically screened and/or stabilized in the ED. I have low suspicion for any other emergent medical condition which would require further screening, evaluation or treatment in the ED or require inpatient management  Final Clinical Impression(s) / ED Diagnoses Final diagnoses:  Left lower quadrant abdominal pain    Rx / DC Orders ED Discharge Orders          Ordered    sucralfate (CARAFATE) 1 g tablet  3 times daily with meals & bedtime,   Status:  Discontinued        02/11/21 1417    pantoprazole (PROTONIX) 20 MG tablet  Daily,   Status:  Discontinued        02/11/21 1417    pantoprazole (PROTONIX) 20 MG tablet  Daily,   Status:  Discontinued        02/11/21 1418    pantoprazole (PROTONIX) 20 MG tablet  Daily        02/11/21 1430    sucralfate (CARAFATE) 1 g tablet  3 times daily with meals & bedtime        02/11/21 1430              Mickie Hillier, PA-C 02/11/21 [place], [place], DO 02/12/21 [id number]

## 2021-02-11 NOTE — ED Notes (Signed)
Patient transported to X-ray 

## 2021-05-05 IMAGING — CT CT ABD-PELV W/ CM
2 of 5 series · 16 of 46 positions shown, 18 images · IV contrast (omnipaque)
Comparison: 09/01/2014.

CLINICAL DATA: Diverticulitis. Left upper quadrant abdominal pain.

EXAM:
CT ABDOMEN AND PELVIS WITH CONTRAST
TECHNIQUE: Multidetector CT imaging of the abdomen and pelvis was performed
using the standard protocol following bolus administration of
intravenous contrast.
CONTRAST:  100mL OMNIPAQUE IOHEXOL 300 MG/ML  SOLN

[Series 2: axial st · axial · 0.82mm/px · z∈[-577,-152]mm · 13 of 97 slices shown, 15 images]
[im 6/97  soft-tissue]
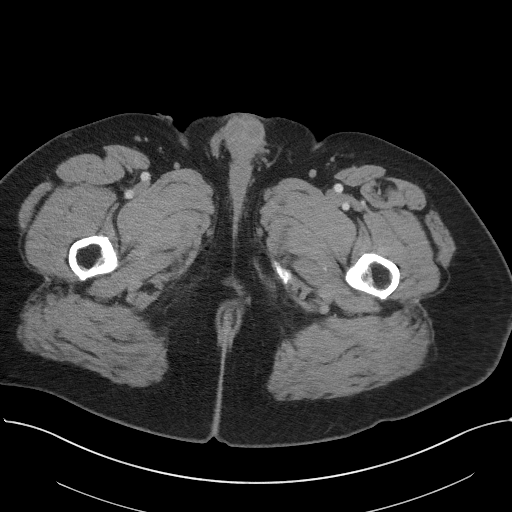
[im 6/97  bone]
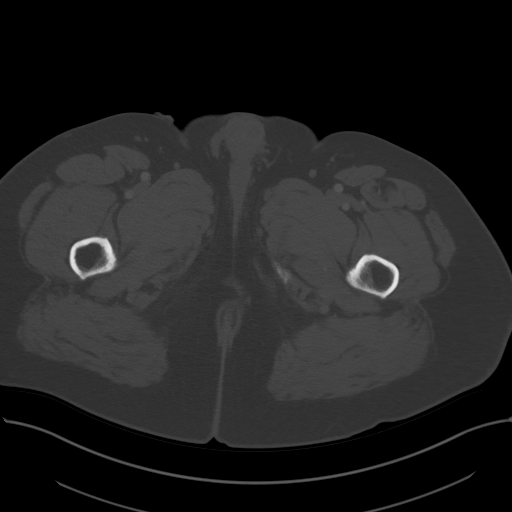
[im 16/97  soft-tissue]
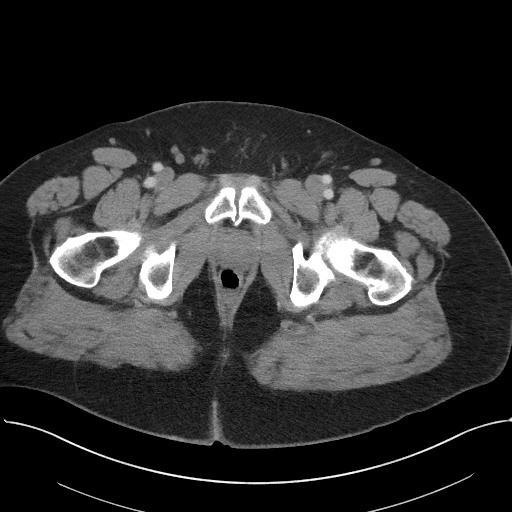
[im 21/97  soft-tissue]
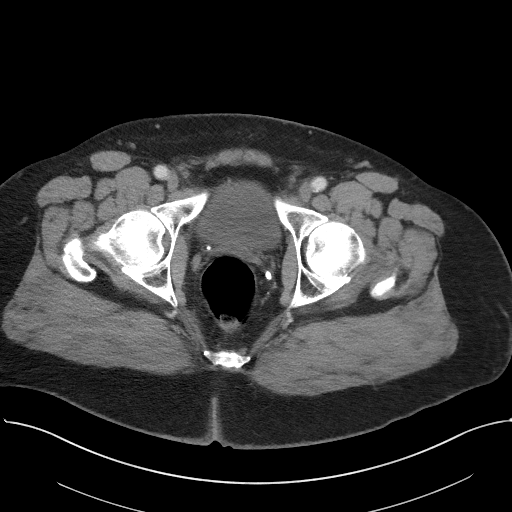
[im 26/97  soft-tissue]
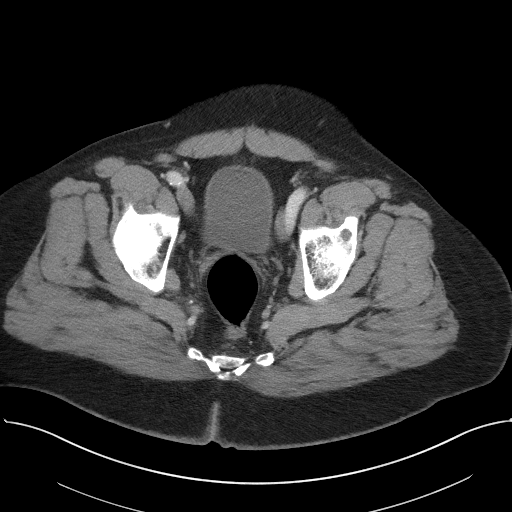
[im 36/97  soft-tissue]
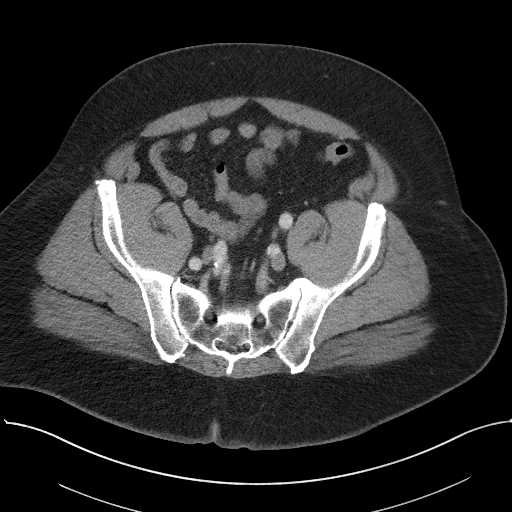
[im 41/97  soft-tissue]
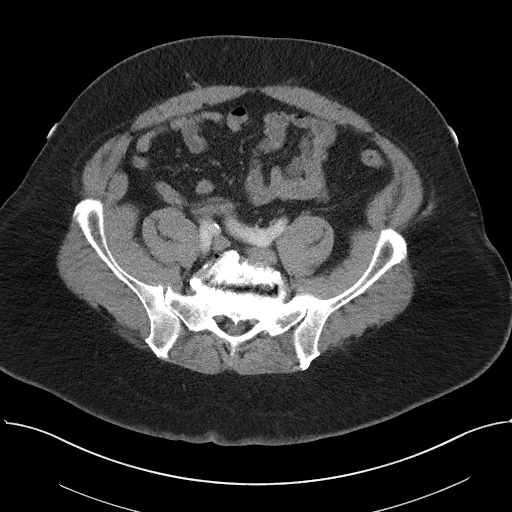
[im 51/97  soft-tissue]
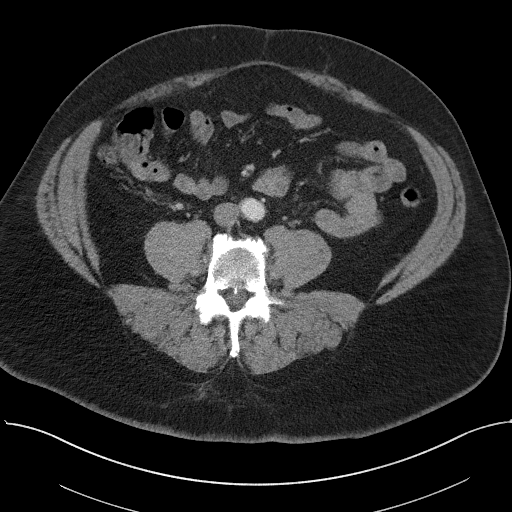
[im 56/97  soft-tissue]
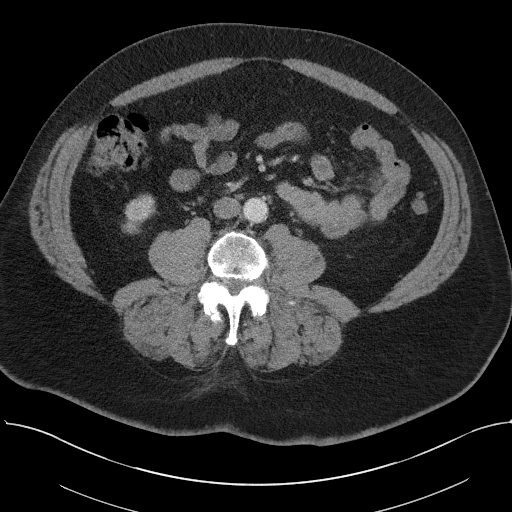
[im 61/97  soft-tissue]
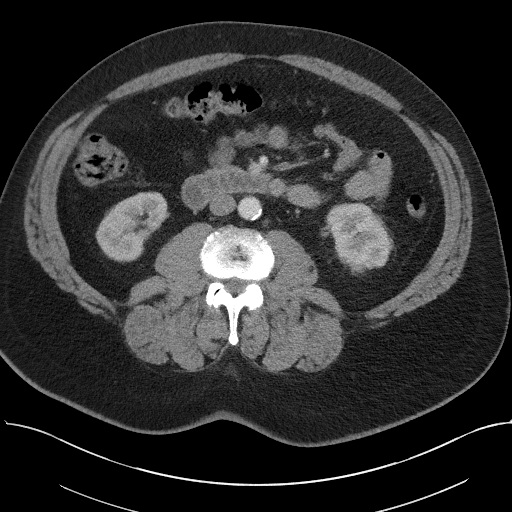
[im 61/97  bone]
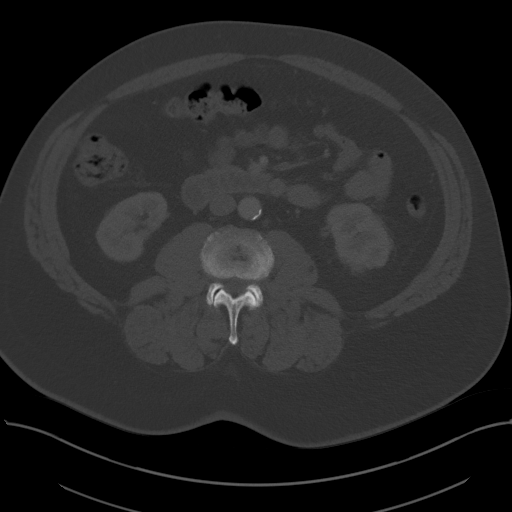
[im 71/97  soft-tissue]
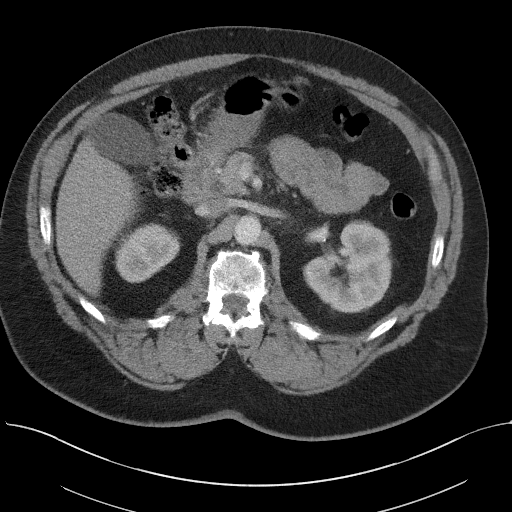
[im 76/97  soft-tissue]
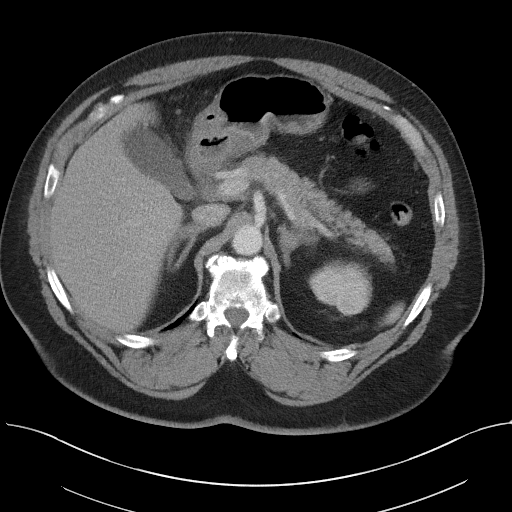
[im 81/97  soft-tissue]
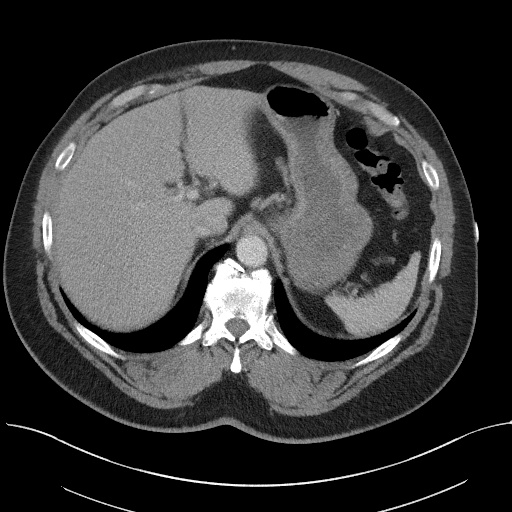
[im 91/97  soft-tissue]
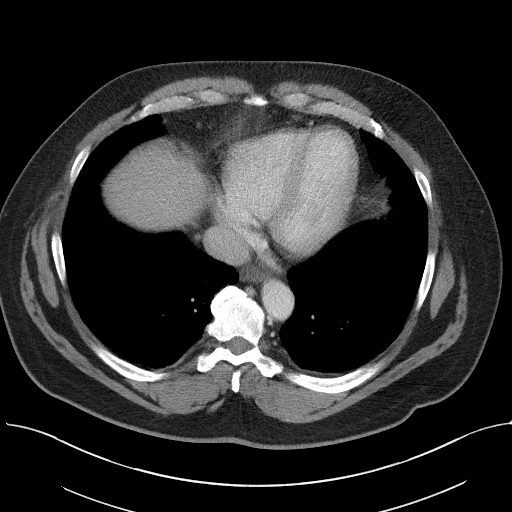

[Series 5: coronal st · coronal · 0.82mm/px · 3 of 120 slices shown]
[im 40/120  soft-tissue]
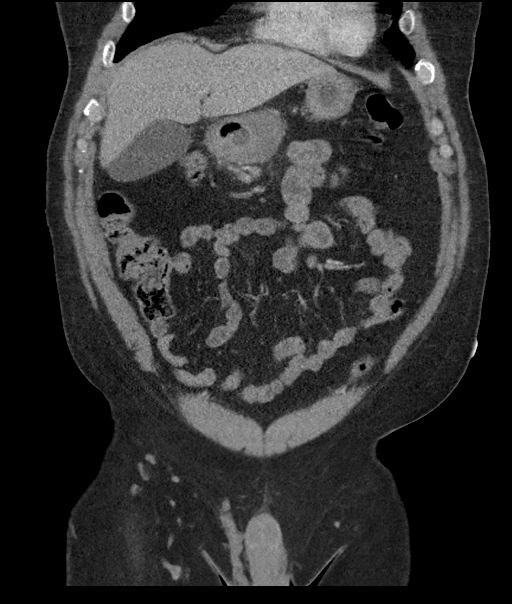
[im 53/120  soft-tissue]
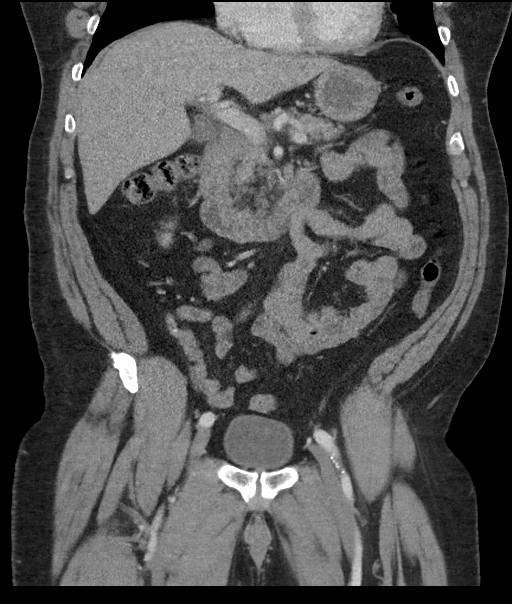
[im 67/120  soft-tissue]
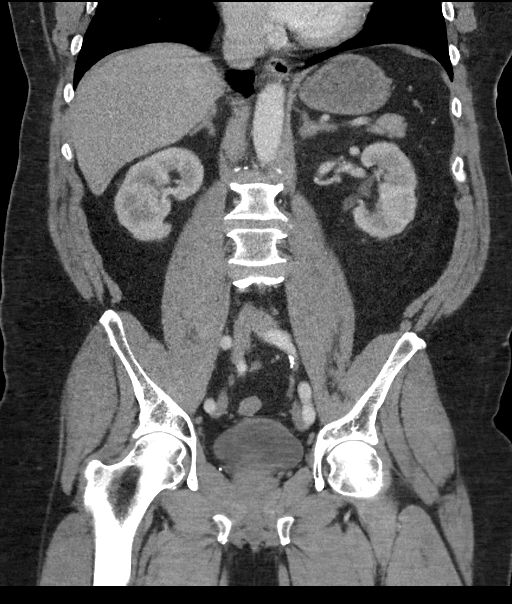

[16 of 46 positions shown; findings below may reference images not displayed]

FINDINGS: Lower chest: The lung bases are clear. The heart size is normal.

Hepatobiliary: The liver is normal. Normal gallbladder.There is no
biliary ductal dilation.

Pancreas: Normal contours without ductal dilatation. No
peripancreatic fluid collection.

Spleen: No splenic laceration or hematoma.

Adrenals/Urinary Tract:

--Adrenal glands: No adrenal hemorrhage.

--Right kidney/ureter: No hydronephrosis or perinephric hematoma.

--Left kidney/ureter: No hydronephrosis or perinephric hematoma.

--Urinary bladder: Unremarkable.

Stomach/Bowel:

--Stomach/Duodenum: There is some wall thickening of the gastric
antrum, pylorus, and proximal duodenum. There is mild adjacent fat
stranding with a few prominent regional lymph nodes.

--Small bowel: No dilatation or inflammation.

--Colon: There are few scattered colonic diverticula without CT
evidence for diverticulitis.

--Appendix: Normal.

Vascular/Lymphatic: Atherosclerotic calcification is present within
the non-aneurysmal abdominal aorta, without hemodynamically
significant stenosis.

--No retroperitoneal lymphadenopathy.

--No mesenteric lymphadenopathy.

--No pelvic or inguinal lymphadenopathy.

Reproductive: Unremarkable

Other: No ascites or free air. The abdominal wall is normal.

Musculoskeletal. No acute displaced fractures.
IMPRESSION: 1. There is some wall thickening of the gastric antrum, pylorus, and
proximal duodenum with mild adjacent fat stranding with a few
prominent regional lymph nodes. Findings are suggestive of
gastritis/duodenitis or peptic ulcer disease.
2. Colonic diverticulosis without diverticulitis.
3. Aortic Atherosclerosis ([id number]).

## 2022-02-17 ENCOUNTER — Emergency Department (HOSPITAL_BASED_OUTPATIENT_CLINIC_OR_DEPARTMENT_OTHER): Payer: 59

## 2022-02-17 ENCOUNTER — Other Ambulatory Visit: Payer: Self-pay

## 2022-02-17 ENCOUNTER — Inpatient Hospital Stay (HOSPITAL_BASED_OUTPATIENT_CLINIC_OR_DEPARTMENT_OTHER)
Admission: EM | Admit: 2022-02-17 | Discharge: 2022-02-21 | DRG: 291 | Disposition: A | Discharge status: Home / Self Care | Payer: 59 | Attending: Internal Medicine | Admitting: Internal Medicine

## 2022-02-17 ENCOUNTER — Encounter (HOSPITAL_BASED_OUTPATIENT_CLINIC_OR_DEPARTMENT_OTHER): Payer: Self-pay | Admitting: Emergency Medicine

## 2022-02-17 DIAGNOSIS — E1165 Type 2 diabetes mellitus with hyperglycemia: Secondary | ICD-10-CM | POA: Diagnosis not present

## 2022-02-17 DIAGNOSIS — I4891 Unspecified atrial fibrillation: Secondary | ICD-10-CM | POA: Diagnosis present

## 2022-02-17 DIAGNOSIS — I10 Essential (primary) hypertension: Secondary | ICD-10-CM

## 2022-02-17 DIAGNOSIS — N182 Chronic kidney disease, stage 2 (mild): Secondary | ICD-10-CM | POA: Diagnosis present

## 2022-02-17 DIAGNOSIS — I440 Atrioventricular block, first degree: Secondary | ICD-10-CM | POA: Diagnosis present

## 2022-02-17 DIAGNOSIS — E66812 Obesity, class 2: Secondary | ICD-10-CM | POA: Insufficient documentation

## 2022-02-17 DIAGNOSIS — E669 Obesity, unspecified: Secondary | ICD-10-CM | POA: Diagnosis not present

## 2022-02-17 DIAGNOSIS — N179 Acute kidney failure, unspecified: Secondary | ICD-10-CM | POA: Diagnosis not present

## 2022-02-17 DIAGNOSIS — Z9221 Personal history of antineoplastic chemotherapy: Secondary | ICD-10-CM | POA: Diagnosis not present

## 2022-02-17 DIAGNOSIS — J439 Emphysema, unspecified: Secondary | ICD-10-CM | POA: Diagnosis present

## 2022-02-17 DIAGNOSIS — I5023 Acute on chronic systolic (congestive) heart failure: Secondary | ICD-10-CM | POA: Diagnosis present

## 2022-02-17 DIAGNOSIS — I429 Cardiomyopathy, unspecified: Secondary | ICD-10-CM

## 2022-02-17 DIAGNOSIS — E876 Hypokalemia: Secondary | ICD-10-CM | POA: Diagnosis not present

## 2022-02-17 DIAGNOSIS — Z791 Long term (current) use of non-steroidal anti-inflammatories (NSAID): Secondary | ICD-10-CM

## 2022-02-17 DIAGNOSIS — E782 Mixed hyperlipidemia: Secondary | ICD-10-CM | POA: Diagnosis not present

## 2022-02-17 DIAGNOSIS — B182 Chronic viral hepatitis C: Secondary | ICD-10-CM | POA: Diagnosis not present

## 2022-02-17 DIAGNOSIS — I420 Dilated cardiomyopathy: Secondary | ICD-10-CM | POA: Diagnosis present

## 2022-02-17 DIAGNOSIS — I509 Heart failure, unspecified: Secondary | ICD-10-CM

## 2022-02-17 DIAGNOSIS — I4892 Unspecified atrial flutter: Principal | ICD-10-CM

## 2022-02-17 DIAGNOSIS — F17210 Nicotine dependence, cigarettes, uncomplicated: Secondary | ICD-10-CM | POA: Diagnosis not present

## 2022-02-17 DIAGNOSIS — R0602 Shortness of breath: Secondary | ICD-10-CM | POA: Diagnosis present

## 2022-02-17 DIAGNOSIS — I453 Trifascicular block: Secondary | ICD-10-CM

## 2022-02-17 DIAGNOSIS — Z79899 Other long term (current) drug therapy: Secondary | ICD-10-CM | POA: Diagnosis not present

## 2022-02-17 DIAGNOSIS — I5043 Acute on chronic combined systolic (congestive) and diastolic (congestive) heart failure: Secondary | ICD-10-CM | POA: Diagnosis present

## 2022-02-17 DIAGNOSIS — C8330 Diffuse large B-cell lymphoma, unspecified site: Secondary | ICD-10-CM

## 2022-02-17 DIAGNOSIS — I4510 Unspecified right bundle-branch block: Secondary | ICD-10-CM | POA: Diagnosis not present

## 2022-02-17 DIAGNOSIS — R7989 Other specified abnormal findings of blood chemistry: Secondary | ICD-10-CM

## 2022-02-17 DIAGNOSIS — E1122 Type 2 diabetes mellitus with diabetic chronic kidney disease: Secondary | ICD-10-CM | POA: Diagnosis not present

## 2022-02-17 DIAGNOSIS — Z6839 Body mass index (BMI) 39.0-39.9, adult: Secondary | ICD-10-CM

## 2022-02-17 DIAGNOSIS — I484 Atypical atrial flutter: Secondary | ICD-10-CM | POA: Diagnosis not present

## 2022-02-17 DIAGNOSIS — N189 Chronic kidney disease, unspecified: Secondary | ICD-10-CM | POA: Diagnosis present

## 2022-02-17 DIAGNOSIS — Z8619 Personal history of other infectious and parasitic diseases: Secondary | ICD-10-CM

## 2022-02-17 DIAGNOSIS — Z7901 Long term (current) use of anticoagulants: Secondary | ICD-10-CM

## 2022-02-17 DIAGNOSIS — I130 Hypertensive heart and chronic kidney disease with heart failure and stage 1 through stage 4 chronic kidney disease, or unspecified chronic kidney disease: Principal | ICD-10-CM | POA: Diagnosis present

## 2022-02-17 DIAGNOSIS — I5041 Acute combined systolic (congestive) and diastolic (congestive) heart failure: Secondary | ICD-10-CM

## 2022-02-17 DIAGNOSIS — Z1152 Encounter for screening for COVID-19: Secondary | ICD-10-CM | POA: Diagnosis not present

## 2022-02-17 LAB — CBC WITH DIFFERENTIAL/PLATELET
Abs Immature Granulocytes: 0.04 10*3/uL (ref 0.00–0.07)
Basophils Absolute: 0 10*3/uL (ref 0.0–0.1)
Basophils Relative: 0 %
Eosinophils Absolute: 0.1 10*3/uL (ref 0.0–0.5)
Eosinophils Relative: 1 %
HCT: 37.7 % — ABNORMAL LOW (ref 39.0–52.0)
Hemoglobin: 12.2 g/dL — ABNORMAL LOW (ref 13.0–17.0)
Immature Granulocytes: 1 %
Lymphocytes Relative: 20 %
Lymphs Abs: 1.7 10*3/uL (ref 0.7–4.0)
MCH: 31.9 pg (ref 26.0–34.0)
MCHC: 32.4 g/dL (ref 30.0–36.0)
MCV: 98.4 fL (ref 80.0–100.0)
Monocytes Absolute: 0.6 10*3/uL (ref 0.1–1.0)
Monocytes Relative: 7 %
Neutro Abs: 6.2 10*3/uL (ref 1.7–7.7)
Neutrophils Relative %: 71 %
Platelets: 248 10*3/uL (ref 150–400)
RBC: 3.83 MIL/uL — ABNORMAL LOW (ref 4.22–5.81)
RDW: 16.5 % — ABNORMAL HIGH (ref 11.5–15.5)
WBC: 8.7 10*3/uL (ref 4.0–10.5)
nRBC: 0.2 % (ref 0.0–0.2)

## 2022-02-17 LAB — RESP PANEL BY RT-PCR (RSV, FLU A&B, COVID)  RVPGX2
Influenza A by PCR: NEGATIVE
Influenza B by PCR: NEGATIVE
Resp Syncytial Virus by PCR: NEGATIVE
SARS Coronavirus 2 by RT PCR: NEGATIVE

## 2022-02-17 LAB — PROTIME-INR
INR: 1.3 — ABNORMAL HIGH (ref 0.8–1.2)
Prothrombin Time: 16.2 seconds — ABNORMAL HIGH (ref 11.4–15.2)

## 2022-02-17 LAB — BASIC METABOLIC PANEL
Anion gap: 7 (ref 5–15)
BUN: 16 mg/dL (ref 8–23)
CO2: 25 mmol/L (ref 22–32)
Calcium: 8.8 mg/dL — ABNORMAL LOW (ref 8.9–10.3)
Chloride: 105 mmol/L (ref 98–111)
Creatinine, Ser: 1.29 mg/dL — ABNORMAL HIGH (ref 0.61–1.24)
GFR, Estimated: >60 mL/min (ref >60)
Glucose, Bld: 192 mg/dL — ABNORMAL HIGH (ref 70–99)
Potassium: 3.8 mmol/L (ref 3.5–5.1)
Sodium: 137 mmol/L (ref 135–145)

## 2022-02-17 LAB — TROPONIN I (HIGH SENSITIVITY)
Troponin I (High Sensitivity): 40 ng/L — ABNORMAL HIGH (ref <18)
Troponin I (High Sensitivity): 41 ng/L — ABNORMAL HIGH (ref <18)

## 2022-02-17 LAB — D-DIMER, QUANTITATIVE: D-Dimer, Quant: 1.66 ug/mL-FEU — ABNORMAL HIGH (ref 0.00–0.50)

## 2022-02-17 LAB — BRAIN NATRIURETIC PEPTIDE: B Natriuretic Peptide: 681.8 pg/mL — ABNORMAL HIGH (ref 0.0–100.0)

## 2022-02-17 LAB — HIV ANTIBODY (ROUTINE TESTING W REFLEX): HIV Screen 4th Generation wRfx: NONREACTIVE

## 2022-02-17 LAB — LACTIC ACID, PLASMA: Lactic Acid, Venous: 1.9 mmol/L (ref 0.5–1.9)

## 2022-02-17 LAB — MAGNESIUM: Magnesium: 1.7 mg/dL (ref 1.7–2.4)

## 2022-02-17 LAB — TSH: TSH: 1.591 u[IU]/mL (ref 0.350–4.500)

## 2022-02-17 MED ORDER — APIXABAN 5 MG PO TABS
5.0000 mg | ORAL_TABLET | Freq: Two times a day (BID) | ORAL | Status: DC
  Administered 2022-02-17 – 2022-02-21 (×8): 5 mg via ORAL
  Filled 2022-02-17 (×8): qty 1

## 2022-02-17 MED ORDER — POLYETHYLENE GLYCOL 3350 17 G PO PACK: 17.0000 g | PACK | Freq: Every day | ORAL | Status: DC | PRN

## 2022-02-17 MED ORDER — POTASSIUM CHLORIDE CRYS ER 20 MEQ PO TBCR
40.0000 meq | EXTENDED_RELEASE_TABLET | Freq: Once | ORAL | Status: AC
  Administered 2022-02-17: 40 meq via ORAL
  Filled 2022-02-17: qty 2

## 2022-02-17 MED ORDER — GABAPENTIN 300 MG PO CAPS
300.0000 mg | ORAL_CAPSULE | Freq: Every day | ORAL | Status: DC
  Administered 2022-02-17 – 2022-02-20 (×4): 300 mg via ORAL
  Filled 2022-02-17 (×4): qty 1

## 2022-02-17 MED ORDER — ACETAMINOPHEN 650 MG RE SUPP: 650.0000 mg | Freq: Four times a day (QID) | RECTAL | Status: DC | PRN

## 2022-02-17 MED ORDER — AMLODIPINE BESYLATE 10 MG PO TABS
10.0000 mg | ORAL_TABLET | Freq: Every day | ORAL | Status: DC
  Administered 2022-02-18 – 2022-02-19 (×2): 10 mg via ORAL
  Filled 2022-02-17 (×2): qty 1

## 2022-02-17 MED ORDER — FUROSEMIDE 40 MG PO TABS
40.0000 mg | ORAL_TABLET | Freq: Once | ORAL | Status: AC
  Administered 2022-02-17: 40 mg via ORAL
  Filled 2022-02-17: qty 1

## 2022-02-17 MED ORDER — MAGNESIUM SULFATE 2 GM/50ML IV SOLN
2.0000 g | Freq: Once | INTRAVENOUS | Status: AC
  Administered 2022-02-17: 2 g via INTRAVENOUS
  Filled 2022-02-17: qty 50

## 2022-02-17 MED ORDER — ACETAMINOPHEN 325 MG PO TABS
650.0000 mg | ORAL_TABLET | Freq: Four times a day (QID) | ORAL | Status: DC | PRN
  Administered 2022-02-18 – 2022-02-20 (×4): 650 mg via ORAL
  Filled 2022-02-17 (×5): qty 2

## 2022-02-17 MED ORDER — IPRATROPIUM-ALBUTEROL 0.5-2.5 (3) MG/3ML IN SOLN: 3.0000 mL | Freq: Four times a day (QID) | RESPIRATORY_TRACT | Status: DC | PRN

## 2022-02-17 MED ORDER — FUROSEMIDE 10 MG/ML IJ SOLN
40.0000 mg | Freq: Two times a day (BID) | INTRAMUSCULAR | Status: DC
  Administered 2022-02-18: 40 mg via INTRAVENOUS
  Filled 2022-02-17: qty 4

## 2022-02-17 MED ORDER — FUROSEMIDE 10 MG/ML IJ SOLN
20.0000 mg | Freq: Once | INTRAMUSCULAR | Status: AC
  Administered 2022-02-17: 20 mg via INTRAVENOUS
  Filled 2022-02-17: qty 2

## 2022-02-17 MED ORDER — METOPROLOL TARTRATE 25 MG PO TABS
25.0000 mg | ORAL_TABLET | Freq: Two times a day (BID) | ORAL | Status: DC
  Administered 2022-02-18: 25 mg via ORAL
  Filled 2022-02-17: qty 1

## 2022-02-17 MED ORDER — IOHEXOL 350 MG/ML SOLN
80.0000 mL | Freq: Once | INTRAVENOUS | Status: AC | PRN
  Administered 2022-02-17: 80 mL via INTRAVENOUS

## 2022-02-17 MED ORDER — ENOXAPARIN SODIUM 150 MG/ML IJ SOSY
1.0000 mg/kg | PREFILLED_SYRINGE | Freq: Once | INTRAMUSCULAR | Status: AC
  Administered 2022-02-17: 132 mg via SUBCUTANEOUS
  Filled 2022-02-17: qty 1

## 2022-02-17 MED ORDER — ATORVASTATIN CALCIUM 10 MG PO TABS
20.0000 mg | ORAL_TABLET | Freq: Every day | ORAL | Status: DC
  Administered 2022-02-18 – 2022-02-21 (×4): 20 mg via ORAL
  Filled 2022-02-17 (×4): qty 2

## 2022-02-17 MED ORDER — SODIUM CHLORIDE 0.9% FLUSH
3.0000 mL | Freq: Two times a day (BID) | INTRAVENOUS | Status: DC
  Administered 2022-02-17 – 2022-02-20 (×7): 3 mL via INTRAVENOUS

## 2022-02-17 NOTE — ED Provider Notes (Signed)
[facility] Provider Note   CSN: [id number] Arrival date & time: 02/17/22  1050     History  Chief Complaint  Patient presents with   Shortness of Breath    Terry Beltran is a 67 y.o. male.  He has a remote history of lymphoma.  Complaining of worsening shortness of breath and swelling on his legs right greater than left over the last week.  Said a nonproductive cough.  Dyspnea on exertion.  He denies fevers chills chest pain abdominal pain.  He said no real pain in his legs but they are sometimes pins-and-needles when they get more swollen.  He is on 20 mg of Lasix a day.  He has a new appointment with a cardiologist in a few weeks but he did not feel he could wait.  He is a smoker although he said he has been cutting back.  The history is provided by the patient.  Shortness of Breath Severity:  Moderate Onset quality:  Gradual Duration:  1 week Timing:  Constant Progression:  Worsening Chronicity:  New Relieved by:  Nothing Worsened by:  Activity Ineffective treatments:  Rest Associated symptoms: cough and wheezing   Associated symptoms: no abdominal pain, no chest pain, no fever, no hemoptysis, no sputum production and no vomiting        Home Medications Prior to Admission medications   Medication Sig Start Date End Date Taking? Authorizing Provider  famotidine (PEPCID) 20 MG tablet Take 1 tablet (20 mg total) by mouth 2 (two) times daily. 03/11/19 04/10/19  Nuala Alpha A, PA-C  pantoprazole (PROTONIX) 20 MG tablet Take 1 tablet (20 mg total) by mouth daily. 02/11/21 03/13/21  Mickie Hillier, PA-C  sucralfate (CARAFATE) 1 g tablet Take 1 tablet (1 g total) by mouth 4 (four) times daily -  with meals and at bedtime. 02/11/21   Mickie Hillier, PA-C      Allergies    Patient has no known allergies.    Review of Systems   Review of Systems  Constitutional:  Negative for fever.  Respiratory:  Positive for cough, shortness  of breath and wheezing. Negative for hemoptysis and sputum production.   Cardiovascular:  Positive for leg swelling. Negative for chest pain.  Gastrointestinal:  Negative for abdominal pain and vomiting.  Musculoskeletal:  Positive for back pain.    Physical Exam Updated Vital Signs BP 122/78 (BP Location: Left Arm)   Pulse 96   Temp 97.9 F (36.6 C) (Oral)   Resp (!) 24   SpO2 93%  Physical Exam Vitals and nursing note reviewed.  Constitutional:      General: He is not in acute distress.    Appearance: He is well-developed.  HENT:     Head: Normocephalic and atraumatic.  Eyes:     Conjunctiva/sclera: Conjunctivae normal.  Cardiovascular:     Rate and Rhythm: Normal rate. Rhythm irregular.     Heart sounds: No murmur heard. Pulmonary:     Effort: Tachypnea and accessory muscle usage present. No respiratory distress.     Breath sounds: Wheezing present.  Abdominal:     Palpations: Abdomen is soft.     Tenderness: There is no abdominal tenderness.  Musculoskeletal:        General: No swelling.     Cervical back: Neck supple.     Right lower leg: Edema present.     Left lower leg: Edema present.  Skin:    General: Skin is  warm and dry.     Capillary Refill: Capillary refill takes less than 2 seconds.  Neurological:     General: No focal deficit present.     Mental Status: He is alert.     ED Results / Procedures / Treatments   Labs (all labs ordered are listed, but only abnormal results are displayed) Labs Reviewed  BASIC METABOLIC PANEL - Abnormal; Notable for the following components:      Result Value   Glucose, Bld 192 (*)    Creatinine, Ser 1.29 (*)    Calcium 8.8 (*)    All other components within normal limits  BRAIN NATRIURETIC PEPTIDE - Abnormal; Notable for the following components:   B Natriuretic Peptide 681.8 (*)    All other components within normal limits  CBC WITH DIFFERENTIAL/PLATELET - Abnormal; Notable for the following components:   RBC  3.83 (*)    Hemoglobin 12.2 (*)    HCT 37.7 (*)    RDW 16.5 (*)    All other components within normal limits  PROTIME-INR - Abnormal; Notable for the following components:   Prothrombin Time 16.2 (*)    INR 1.3 (*)    All other components within normal limits  D-DIMER, QUANTITATIVE - Abnormal; Notable for the following components:   D-Dimer, Quant 1.66 (*)    All other components within normal limits  TROPONIN I (HIGH SENSITIVITY) - Abnormal; Notable for the following components:   Troponin I (High Sensitivity) 40 (*)    All other components within normal limits  TROPONIN I (HIGH SENSITIVITY) - Abnormal; Notable for the following components:   Troponin I (High Sensitivity) 41 (*)    All other components within normal limits  RESP PANEL BY RT-PCR (RSV, FLU A&B, COVID)  RVPGX2  CULTURE, BLOOD (ROUTINE X 2)  CULTURE, BLOOD (ROUTINE X 2)  LACTIC ACID, PLASMA  MAGNESIUM  TSH  HIV ANTIBODY (ROUTINE TESTING W REFLEX)  COMPREHENSIVE METABOLIC PANEL  CBC  MAGNESIUM    EKG EKG Interpretation  Date/Time:  Sunday February 17 2022 11:06:21 EST Ventricular Rate:  64 PR Interval:    QRS Duration: 128 QT Interval:  487 QTC Calculation: 466 R Axis:   -63 Text Interpretation: Atrial flutter Paired ventricular premature complexes RBBB and LAFB Abnormal T, consider ischemia, lateral leads Minimal ST elevation, lateral leads flutter new from prior 1/23 Confirmed by Aletta Edouard [phone]) on 02/17/2022 11:16:49 AM  Radiology CT Angio Chest PE W/Cm &/Or Wo Cm  Result Date: 02/17/2022 CLINICAL DATA:  Shortness of breath and swelling of the right leg. EXAM: CT ANGIOGRAPHY CHEST WITH CONTRAST TECHNIQUE: Multidetector CT imaging of the chest was performed using the standard protocol during bolus administration of intravenous contrast. Multiplanar CT image reconstructions and MIPs were obtained to evaluate the vascular anatomy. RADIATION DOSE REDUCTION: This exam was performed according to the  departmental dose-optimization program which includes automated exposure control, adjustment of the mA and/or kV according to patient size and/or use of iterative reconstruction technique. CONTRAST:  11m OMNIPAQUE IOHEXOL 350 MG/ML SOLN COMPARISON:  CT examination dated May 07, 2013 FINDINGS: Cardiovascular: Satisfactory opacification of the pulmonary arteries to the segmental level. No evidence of pulmonary embolism. Normal enlarged. No pericardial effusion. Reflux of contrast into the hepatic veins suggesting right heart failure. Mediastinum/Nodes: No enlarged mediastinal, hilar, or axillary lymph nodes. Thyroid gland, trachea, and esophagus demonstrate no significant findings. Lungs/Pleura: Biapical pleural/parenchymal scarring with large subpleural and centrilobular emphysematous bulla. Small bilateral pleural effusions, right greater than the left. Mild  bibasilar atelectasis. No evidence of pneumonia. Upper Abdomen: No acute abnormality. Prominent left adrenal gland suggesting adrenal hyperplasia. Musculoskeletal: Degenerate disc disease with prominent anterior osteophytes. No acute osseous abnormality Review of the MIP images confirms the above findings. IMPRESSION: 1. No evidence of pulmonary embolism. 2. Small bilateral pleural effusions, right greater than left with associated bibasilar atelectasis. 3. Biapical pleural/parenchymal scarring with large subpleural and centrilobular emphysematous bulla. 4. Heart is enlarged. Reflux of contrast into the hepatic veins suggesting right heart failure. 5. No evidence of pneumonia or pulmonary edema. 6. 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 No acute osseous abnormality. 7. Prominent left adrenal gland suggesting adrenal hyperplasia. Emphysema (ICD10-J43.9). Electronically Signed   By: Keane Police D.O.   On: 02/17/2022 13:19   DG Chest Port 1 View  Result Date: 02/17/2022 CLINICAL DATA:  Shortness of breath. EXAM: PORTABLE CHEST 1 VIEW COMPARISON:  CXR  02/11/21 FINDINGS: No pleural effusion. No pneumothorax. Compared to prior exam there is interval increase in size of the cardiac contours, which could suggest presence of a pericardial effusion. There are prominent bilateral interstitial opacities, suggestive of either atypical infection or pulmonary venous congestion. No radiographically apparent displaced rib fractures. No focal airspace opacity. IMPRESSION: 1. Compared to prior exam there is interval increase in size of the cardiac contours, which could suggest presence of a pericardial effusion. Correlate with echocardiography. 2. Prominent bilateral interstitial opacities, suggestive of either atypical infection or pulmonary venous congestion. Electronically Signed   By: Marin Roberts M.D.   On: 02/17/2022 11:34    Procedures .Critical Care  Performed by: Hayden Rasmussen, MD Authorized by: Hayden Rasmussen, MD   Critical care provider statement:    Critical care time (minutes):  45   Critical care time was exclusive of:  Separately billable procedures and treating other patients   Critical care was necessary to treat or prevent imminent or life-threatening deterioration of the following conditions:  Cardiac failure and respiratory failure   Critical care was time spent personally by me on the following activities:  Development of treatment plan with patient or surrogate, discussions with consultants, evaluation of patient's response to treatment, examination of patient, obtaining history from patient or surrogate, ordering and performing treatments and interventions, ordering and review of laboratory studies, ordering and review of radiographic studies, pulse oximetry, re-evaluation of patient's condition and review of old charts   I assumed direction of critical care for this patient from another provider in my specialty: no       Medications Ordered in ED Medications  furosemide (LASIX) injection 40 mg (has no administration in time  range)  sodium chloride flush (NS) 0.9 % injection 3 mL (has no administration in time range)  acetaminophen (TYLENOL) tablet 650 mg (has no administration in time range)    Or  acetaminophen (TYLENOL) suppository 650 mg (has no administration in time range)  polyethylene glycol (MIRALAX / GLYCOLAX) packet 17 g (has no administration in time range)  magnesium sulfate IVPB 2 g 50 mL (has no administration in time range)  apixaban (ELIQUIS) tablet 5 mg (has no administration in time range)  amLODipine (NORVASC) tablet 10 mg (has no administration in time range)  atorvastatin (LIPITOR) tablet 20 mg (has no administration in time range)  gabapentin (NEURONTIN) capsule 300 mg (has no administration in time range)  metoprolol tartrate (LOPRESSOR) tablet 25 mg (has no administration in time range)  iohexol (OMNIPAQUE) 350 MG/ML injection 80 mL (80 mLs Intravenous Contrast Given 02/17/22 1225)  furosemide (LASIX) tablet 40 mg (40 mg Oral Given 02/17/22 1359)  potassium chloride SA (KLOR-CON M) CR tablet 40 mEq (40 mEq Oral Given 02/17/22 1359)  enoxaparin (LOVENOX) injection 132 mg (132 mg Subcutaneous Given 02/17/22 1439)  furosemide (LASIX) injection 20 mg (20 mg Intravenous Given 02/17/22 1440)    ED Course/ Medical Decision Making/ A&P Clinical Course as of 02/17/22 1722  Sun Feb 17, 2022  1156 X-ray interpreted by me as cardiomegaly and probable vascular congestion.  Awaiting radiology reading [MB]  1427 Discussed with Dr. Grandville Silos [facility] hospitalist to agrees with admission.  Asking for Lovenox.  Will get cardiology input when patient arrives on [facility] campus. [MB]    Clinical Course User Index [MB] Hayden Rasmussen, MD                             Medical Decision Making Amount and/or Complexity of Data Reviewed Labs: ordered. Radiology: ordered.  Risk Prescription drug management. Decision regarding hospitalization.   This patient complains of shortness of breath dyspnea on exertion leg  swelling; this involves an extensive number of treatment Options and is a complaint that carries with it a high risk of complications and morbidity. The differential includes CHF, COPD, ACS, anemia, metabolic derangement, COVID, flu, PE, DVT  I ordered, reviewed and interpreted labs, which included CBC with normal white count, hemoglobin slightly down from priors, chemistries with elevated glucose elevated creatinine, D-dimer strongly positive, troponins elevated but flat, BNP elevated, COVID and flu negative, lactate normal I ordered medication oral and IV Lasix, subcu Lovenox, oral potassium and reviewed PMP when indicated. I ordered imaging studies which included chest x-ray and CT angio chest and I independently    visualized and interpreted imaging which showed cardiomegaly no definite PE.  Does have some emphysematous changes and signs of right heart failure Previous records obtained and reviewed in epic no recent admissions I consulted Dr. Grandville Silos [facility] hospitalist and discussed lab and imaging findings and discussed disposition.  Cardiac monitoring reviewed, atrial flutter with controlled rate Social determinants considered, ongoing tobacco use Critical Interventions: Initiation of IV diuresis and anticoagulation  After the interventions stated above, I reevaluated the patient and found patient to be satting well on room air although still with mild work of breathing Admission and further testing considered, he would benefit from mission to the hospital for further assessment of cardiac status, likely needs cardiac echo and may need cardiology consult.         Final Clinical Impression(s) / ED Diagnoses Final diagnoses:  New onset atrial flutter ([place])  New onset of congestive heart failure ([place])    Rx / DC Orders ED Discharge Orders     None         Hayden Rasmussen, MD 02/17/22 1726

## 2022-02-17 NOTE — ED Notes (Signed)
Report given to [facility] with [facility]

## 2022-02-17 NOTE — ED Notes (Signed)
Called [facility] for transport at 2:34.

## 2022-02-17 NOTE — ED Notes (Signed)
   02/17/22 1122  Therapy Vitals  Pulse Rate 60  MEWS Score/Color  MEWS Score 1  MEWS Score Color Green  Respiratory Assessment  Assessment Type Assess only  Respiratory Pattern Regular;Labored;Dyspnea at rest;Dyspnea with exertion  Chest Assessment Chest expansion symmetrical  Cough Non-productive  Bilateral Breath Sounds Diminished;Expiratory wheezes  Oxygen Therapy/Pulse Ox  O2 Device Room [employer]

## 2022-02-17 NOTE — H&P (Addendum)
History and Physical   Terry Beltran GSU:[id number] DOB: 10-14-1955 DOA: 02/17/2022  PCP: Karie Mainland, FNP   Patient coming from: Home  Chief Complaint: Shortness of breath  HPI: Terry Beltran is a 67 y.o. male with medical history significant of hepatitis C, hypertension, hyperlipidemia, non-Hodgkin's lymphoma status postchemotherapy in 2016 now in remission, presenting with shortness of breath.  Patient reports about 2 weeks of worsening shortness of breath and lower extremity edema which is worse on the right than the left.  He went to his PCP about a week and a half ago who started him on 20 mg Lasix daily and made a referral to cardiology.  He also reports a nonproductive cough and dyspnea on exertion.  He is currently taking his prescribed 20 mg of Lasix daily.  He was scheduled to establish with a cardiologist in a few weeks but felt he could not wait for this evaluation.  Does report breathing easier when sitting upright. He denies fevers, chills, chest pain, abdominal pain, constipation, diarrhea, nausea, vomiting.  ED Course: Vital signs in the ED significant for heart rate in the 60s to 70s in atrial flutter, blood pressure in the 859Y to 924M systolic, respiratory rate in the teens to 20s.  Lab workup included BMP with creatinine of 1.29 near baseline at 1.2, glucose 192, calcium 8.8.  CBC with hemoglobin stable at 12.2, PT and INR mildly elevated at 16.2 and 1.3 respectively.  Troponin mildly elevated but flat at 40 and then 41 and repeat.  D-dimer elevated to 1.66, BNP elevated to 681.  Respiratory panel for flu COVID RSV negative.  TSH pending.  Lactic acid and blood cultures pending.  Chest x-ray showed increased cardiac size possibly representing effusion, bilateral opacity which could represent congestion or atypical infection.  CTA PE study performed was negative for PE but did show pleural scarring and emphysema in the apices, cardiomegaly with contrast reflux possibly  representing right heart failure.  No evidence of pneumonia or pulmonary edema.  No pericardial effusion.  Patient received Lasix, Lovenox, 40 mill equivalents p.o. potassium in the ED.  Review of Systems: As per HPI otherwise all other systems reviewed and are negative.  Past Medical History:  Diagnosis Date   Cancer [facility])    Hypertension     History reviewed. No pertinent surgical history.  Social History  reports that he has been smoking cigarettes. He has never used smokeless tobacco. He reports that he does not currently use alcohol. He reports current drug use. Drug: Marijuana.  No Known Allergies  History reviewed. No pertinent family history.   Prior to Admission medications   Medication Sig Start Date End Date Taking? Authorizing Provider  amLODipine (NORVASC) 10 MG tablet Take 1 tablet by mouth daily. 07/25/14  Yes [provider]  atorvastatin (LIPITOR) 20 MG tablet Take 1 tablet by mouth daily. 01/29/22  Yes [provider]  furosemide (LASIX) 20 MG tablet Take 20 mg by mouth daily. 01/25/22  Yes [provider]  gabapentin (NEURONTIN) 300 MG capsule Take 1 capsule by mouth at bedtime. 01/29/22  Yes [provider]  meloxicam (MOBIC) 15 MG tablet Take 15 mg by mouth daily. 12/11/21  Yes [provider]  metoprolol tartrate (LOPRESSOR) 25 MG tablet Take 25 mg by mouth 2 (two) times daily. 08/20/14  Yes [provider]  famotidine (PEPCID) 20 MG tablet Take 1 tablet (20 mg total) by mouth 2 (two) times daily. 03/11/19 04/10/19  Nuala Alpha A, PA-C  pantoprazole (  PROTONIX) 20 MG tablet Take 1 tablet (20 mg total) by mouth daily. 02/11/21 03/13/21  Mickie Hillier, PA-C  sucralfate (CARAFATE) 1 g tablet Take 1 tablet (1 g total) by mouth 4 (four) times daily -  with meals and at bedtime. Patient not taking: Reported on 02/17/2022 02/11/21   Mickie Hillier, PA-C    Physical Exam: Vitals:   02/17/22 1415 02/17/22 1445 02/17/22  1500 02/17/22 1557  BP:   (!) 128/92 (!) 151/105  Pulse: 70  78 76  Resp: 17 20 (!) 27 (!) 26  Temp:    98 F (36.7 C)  TempSrc:    Oral  SpO2: 98%  98% 94%  Weight:    128 kg  Height:    '5\' 10"'$  (1.778 m)    Physical Exam Constitutional:      General: He is not in acute distress.    Appearance: Normal appearance.  HENT:     Head: Normocephalic and atraumatic.     Mouth/Throat:     Mouth: Mucous membranes are moist.     Pharynx: Oropharynx is clear.  Eyes:     Extraocular Movements: Extraocular movements intact.     Pupils: Pupils are equal, round, and reactive to light.  Cardiovascular:     Rate and Rhythm: Normal rate. Rhythm irregular.     Pulses: Normal pulses.     Heart sounds: Normal heart sounds.  Pulmonary:     Effort: Pulmonary effort is normal. No respiratory distress.     Breath sounds: Examination of the right-upper field reveals wheezing. Examination of the left-upper field reveals wheezing. Wheezing present.  Abdominal:     General: Bowel sounds are normal. There is no distension.     Palpations: Abdomen is soft.     Tenderness: There is no abdominal tenderness.  Musculoskeletal:        General: No swelling or deformity.     Right lower leg: Edema present.     Left lower leg: Edema present.  Skin:    General: Skin is warm and dry.  Neurological:     General: No focal deficit present.     Mental Status: Mental status is at baseline.    Labs on Admission: I have personally reviewed following labs and imaging studies  CBC: Recent Labs  Lab 02/17/22 1113  WBC 8.7  NEUTROABS 6.2  HGB 12.2*  HCT 37.7*  MCV 98.4  PLT 245    Basic Metabolic Panel: Recent Labs  Lab 02/17/22 1113  NA 137  K 3.8  CL 105  CO2 25  GLUCOSE 192*  BUN 16  CREATININE 1.29*  CALCIUM 8.8*  MG 1.7    GFR: Estimated Creatinine Clearance: 73.6 mL/min (A) (by C-G formula based on SCr of 1.29 mg/dL (H)).  Liver Function Tests: No results for input(s): "AST", "ALT",  "ALKPHOS", "BILITOT", "PROT", "ALBUMIN" in the last 168 hours.  Urine analysis:    Component Value Date/Time   COLORURINE YELLOW 03/11/2019 [place] 03/11/2019 1555   LABSPEC 1.025 03/11/2019 1555   PHURINE 5.5 03/11/2019 1555   GLUCOSEU NEGATIVE 03/11/2019 1555   HGBUR TRACE (A) 03/11/2019 1555   BILIRUBINUR NEGATIVE 03/11/2019 1555   KETONESUR 15 (A) 03/11/2019 1555   PROTEINUR NEGATIVE 03/11/2019 1555   NITRITE NEGATIVE 03/11/2019 1555   LEUKOCYTESUR NEGATIVE 03/11/2019 1555    Radiological Exams on Admission: CT Angio Chest PE W/Cm &/Or Wo Cm  Result Date: 02/17/2022 CLINICAL DATA:  Shortness of breath and  swelling of the right leg. EXAM: CT ANGIOGRAPHY CHEST WITH CONTRAST TECHNIQUE: Multidetector CT imaging of the chest was performed using the standard protocol during bolus administration of intravenous contrast. Multiplanar CT image reconstructions and MIPs were obtained to evaluate the vascular anatomy. RADIATION DOSE REDUCTION: This exam was performed according to the departmental dose-optimization program which includes automated exposure control, adjustment of the mA and/or kV according to patient size and/or use of iterative reconstruction technique. CONTRAST:  7m OMNIPAQUE IOHEXOL 350 MG/ML SOLN COMPARISON:  CT examination dated May 07, 2013 FINDINGS: Cardiovascular: Satisfactory opacification of the pulmonary arteries to the segmental level. No evidence of pulmonary embolism. Normal enlarged. No pericardial effusion. Reflux of contrast into the hepatic veins suggesting right heart failure. Mediastinum/Nodes: No enlarged mediastinal, hilar, or axillary lymph nodes. Thyroid gland, trachea, and esophagus demonstrate no significant findings. Lungs/Pleura: Biapical pleural/parenchymal scarring with large subpleural and centrilobular emphysematous bulla. Small bilateral pleural effusions, right greater than the left. Mild bibasilar atelectasis. No evidence of  pneumonia. Upper Abdomen: No acute abnormality. Prominent left adrenal gland suggesting adrenal hyperplasia. Musculoskeletal: Degenerate disc disease with prominent anterior osteophytes. No acute osseous abnormality Review of the MIP images confirms the above findings. IMPRESSION: 1. No evidence of pulmonary embolism. 2. Small bilateral pleural effusions, right greater than left with associated bibasilar atelectasis. 3. Biapical pleural/parenchymal scarring with large subpleural and centrilobular emphysematous bulla. 4. Heart is enlarged. Reflux of contrast into the hepatic veins suggesting right heart failure. 5. No evidence of pneumonia or pulmonary edema. 6. 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 No acute osseous abnormality. 7. Prominent left adrenal gland suggesting adrenal hyperplasia. Emphysema (ICD10-J43.9). Electronically Signed   By: IKeane PoliceD.O.   On: 02/17/2022 13:19   DG Chest Port 1 View  Result Date: 02/17/2022 CLINICAL DATA:  Shortness of breath. EXAM: PORTABLE CHEST 1 VIEW COMPARISON:  CXR 02/11/21 FINDINGS: No pleural effusion. No pneumothorax. Compared to prior exam there is interval increase in size of the cardiac contours, which could suggest presence of a pericardial effusion. There are prominent bilateral interstitial opacities, suggestive of either atypical infection or pulmonary venous congestion. No radiographically apparent displaced rib fractures. No focal airspace opacity. IMPRESSION: 1. Compared to prior exam there is interval increase in size of the cardiac contours, which could suggest presence of a pericardial effusion. Correlate with echocardiography. 2. Prominent bilateral interstitial opacities, suggestive of either atypical infection or pulmonary venous congestion. Electronically Signed   By: HMarin RobertsM.D.   On: 02/17/2022 11:34    EKG: Independently reviewed.  A flutter rhythm at 64 bpm, PVC, right bundle branch block, nonspecific ST  changes.  Assessment/Plan Principal Problem:   Acute exacerbation of CHF (congestive heart failure) (HCC) Active Problems:   New onset atrial flutter ([place]   New onset of congestive heart failure (HCC)   Diffuse large B-cell lymphoma, unspecified site (HCC)   Chronic hepatitis C virus infection ([place]   Essential hypertension   Mixed hyperlipidemia   Acute CHF > Patient without prior history of CHF presenting with worsening shortness of breath and edema found to have BNP elevated at 681.  Chest x-ray/CTA PE study did not show significant edema but did show possible pulmonary vascular congestion.  Creatinine is near baseline at 1.29.  Magnesium is borderline low at 1.7. > Patient received 60 mg IV Lasix in the ED and a dose of Lovenox.  Also received 40 mill equivalents of potassium p.o. - Monitor on telemetry - Continue with 40 mg IV Lasix  twice daily - Strict I's and O's, daily weights - Echocardiogram - 2 g IV magnesium - Trend renal function and electrolytes - Continue home metoprolol  New onset atrial flutter > Noted to have new onset atrial flutter on EKG in the ED.  Appears to be 4-1 primarily. > Given dose of Lovenox in the ED. - Started on Eliquis - Currently rate is controlled on home metoprolol - On cardiac monitoring as above  Hypertension - Continue home amlodipine, metoprolol - IV Lasix as above  Hyperlipidemia - Continue home atorvastatin  History of non-Hodgkin's lymphoma > In 2016 status post multiple cycles of CHOP, follows with [facility]. - Noted  DVT prophylaxis: Eliquis Code Status:   Full, Discussed at bedside Family Communication:  None on admission, patient states family is aware of admission  Disposition Plan:   Patient is from:  Home  Anticipated DC to:  Home  Anticipated DC date:  1 to 3 days  Anticipated DC barriers: None  Consults called:  None Admission status:  Observation, telemetry  Severity of Illness: The appropriate patient  status for this patient is OBSERVATION. Observation status is judged to be reasonable and necessary in order to provide the required intensity of service to ensure the patient's safety. The patient's presenting symptoms, physical exam findings, and initial radiographic and laboratory data in the context of their medical condition is felt to place them at decreased risk for further clinical deterioration. Furthermore, it is anticipated that the patient will be medically stable for discharge from the hospital within 2 midnights of admission.    Marcelyn Bruins MD [facility]  How to contact the [facility] Attending or Consulting provider [place] or covering provider during after hours [place], for this patient?   Check the care team in [facility] and look for a) attending/consulting [facility] provider listed and b) the [facility] team listed Log into [email or web address] and use [place]'s universal password to access. If you do not have the password, please contact the hospital operator. Locate the [facility] provider you are looking for under [facility] and page to a number that you can be directly reached. If you still have difficulty reaching the provider, please page the [facility] (Director on Call) for the Hospitalists listed on amion for assistance.  02/17/2022, 5:04 PM

## 2022-02-17 NOTE — Plan of Care (Signed)
Plan of Care Note for accepted transfer   Patient: Terry Beltran MRN: [id number]   [place]: 02/17/2022  Facility requesting transfer: [facility] ED Requesting Provider: Dr. Aletta Edouard, EDP Reason for transfer: New onset acute CHF exacerbation/new onset atrial flutter Facility course:  Patient is a 67 year old gentleman, history of hypertension, no significant prior cardiac history, history of remote lymphoma currently being observed, presented to [facility] with a 1 to 2-week history of worsening shortness of breath on minimal exertion, increased lower extremity edema right leg greater than left, noted to have sats of 93% on room air on presentation respiratory rate in the 20s.  Workup with elevated BNP of 681.8.  Troponin is elevated but flattened at 40, 41. D-dimer elevated at 1.66. CT angiogram chest negative for PE however concerning for small bilateral pleural effusions, enlarged heart reflux of contrast into hepatic veins suggesting RHF, no pneumonia or pulmonary edema noted. Chest x-ray done read prominent bilateral interstitial opacities suggestive of either atypical infection or pulmonary venous congestion. EKG done consistent with atrial flutter rate controlled with heart rate in the 50s to 60s. Patient given Lasix as well as a dose of potassium as well as a one-time dose of Lovenox ordered. -Per EDP lower extremity Dopplers pending to rule out DVT.  Plan of care: The patient is accepted for admission to Telemetry unit, at [facility]..  May consider cardiology consultation as patient with new onset CHF with new onset atrial flutter.  Author: Irine Seal, MD 02/17/2022  Check [email or web address] for on-call coverage.  Nursing staff, Please call [place] number on Amion as soon as patient's arrival, so appropriate admitting provider can evaluate the pt.

## 2022-02-17 NOTE — Plan of Care (Signed)
  Problem: Education: Goal: Knowledge of General Education information will improve Description: Including pain rating scale, medication(s)/side effects and non-pharmacologic comfort measures Outcome: Progressing   Problem: Health Behavior/Discharge Planning: Goal: Ability to manage health-related needs will improve Outcome: Progressing   Problem: Clinical Measurements: Goal: Ability to maintain clinical measurements within normal limits will improve Outcome: Progressing Goal: Will remain free from infection Outcome: Progressing Goal: Diagnostic test results will improve Outcome: Progressing   Problem: Nutrition: Goal: Adequate nutrition will be maintained Outcome: Progressing   Problem: Coping: Goal: Level of anxiety will decrease Outcome: Progressing   Problem: Elimination: Goal: Will not experience complications related to bowel motility Outcome: Progressing Goal: Will not experience complications related to urinary retention Outcome: Progressing   Problem: Pain Managment: Goal: General experience of comfort will improve Outcome: Progressing   Problem: Safety: Goal: Ability to remain free from injury will improve Outcome: Progressing   Problem: Skin Integrity: Goal: Risk for impaired skin integrity will decrease Outcome: Progressing

## 2022-02-17 NOTE — ED Triage Notes (Signed)
Pt c/o shortness of breath and swelling to right leg x couple of weeks. Pt has tried prescribed fluid pills with no relief.

## 2022-02-17 NOTE — ED Notes (Signed)
Attempted to call report; provided contact info for call back

## 2022-02-17 NOTE — Progress Notes (Signed)
Approximately 1545-- Pt arrived via [facility] was Terry Beltran. Upon arrival, pt A&O x 4, VSS obtained. Pt currently in A-flutter, tachypneic, dyspnea on exertion and at rest. Full skin assessment provided. Neva Seat, MD notified.

## 2022-02-18 ENCOUNTER — Observation Stay (HOSPITAL_COMMUNITY): Payer: 59

## 2022-02-18 ENCOUNTER — Other Ambulatory Visit (HOSPITAL_COMMUNITY): Payer: Self-pay

## 2022-02-18 ENCOUNTER — Inpatient Hospital Stay (HOSPITAL_COMMUNITY): Payer: 59

## 2022-02-18 DIAGNOSIS — I10 Essential (primary) hypertension: Secondary | ICD-10-CM | POA: Diagnosis not present

## 2022-02-18 DIAGNOSIS — Z791 Long term (current) use of non-steroidal anti-inflammatories (NSAID): Secondary | ICD-10-CM | POA: Diagnosis not present

## 2022-02-18 DIAGNOSIS — Z6839 Body mass index (BMI) 39.0-39.9, adult: Secondary | ICD-10-CM | POA: Diagnosis not present

## 2022-02-18 DIAGNOSIS — I453 Trifascicular block: Secondary | ICD-10-CM | POA: Diagnosis present

## 2022-02-18 DIAGNOSIS — F17210 Nicotine dependence, cigarettes, uncomplicated: Secondary | ICD-10-CM | POA: Diagnosis present

## 2022-02-18 DIAGNOSIS — C8330 Diffuse large B-cell lymphoma, unspecified site: Secondary | ICD-10-CM | POA: Diagnosis present

## 2022-02-18 DIAGNOSIS — I110 Hypertensive heart disease with heart failure: Secondary | ICD-10-CM | POA: Diagnosis not present

## 2022-02-18 DIAGNOSIS — I440 Atrioventricular block, first degree: Secondary | ICD-10-CM | POA: Diagnosis present

## 2022-02-18 DIAGNOSIS — Z1152 Encounter for screening for COVID-19: Secondary | ICD-10-CM | POA: Diagnosis not present

## 2022-02-18 DIAGNOSIS — Z79899 Other long term (current) drug therapy: Secondary | ICD-10-CM | POA: Diagnosis not present

## 2022-02-18 DIAGNOSIS — R609 Edema, unspecified: Secondary | ICD-10-CM

## 2022-02-18 DIAGNOSIS — Z9221 Personal history of antineoplastic chemotherapy: Secondary | ICD-10-CM | POA: Diagnosis not present

## 2022-02-18 DIAGNOSIS — R0602 Shortness of breath: Secondary | ICD-10-CM | POA: Diagnosis present

## 2022-02-18 DIAGNOSIS — E782 Mixed hyperlipidemia: Secondary | ICD-10-CM | POA: Diagnosis present

## 2022-02-18 DIAGNOSIS — E876 Hypokalemia: Secondary | ICD-10-CM | POA: Diagnosis present

## 2022-02-18 DIAGNOSIS — I509 Heart failure, unspecified: Secondary | ICD-10-CM | POA: Diagnosis not present

## 2022-02-18 DIAGNOSIS — N179 Acute kidney failure, unspecified: Secondary | ICD-10-CM | POA: Diagnosis not present

## 2022-02-18 DIAGNOSIS — B182 Chronic viral hepatitis C: Secondary | ICD-10-CM | POA: Diagnosis present

## 2022-02-18 DIAGNOSIS — I5023 Acute on chronic systolic (congestive) heart failure: Secondary | ICD-10-CM | POA: Diagnosis not present

## 2022-02-18 DIAGNOSIS — I5021 Acute systolic (congestive) heart failure: Secondary | ICD-10-CM

## 2022-02-18 DIAGNOSIS — I484 Atypical atrial flutter: Secondary | ICD-10-CM | POA: Diagnosis present

## 2022-02-18 DIAGNOSIS — E1165 Type 2 diabetes mellitus with hyperglycemia: Secondary | ICD-10-CM | POA: Diagnosis present

## 2022-02-18 DIAGNOSIS — N182 Chronic kidney disease, stage 2 (mild): Secondary | ICD-10-CM | POA: Diagnosis present

## 2022-02-18 DIAGNOSIS — E669 Obesity, unspecified: Secondary | ICD-10-CM | POA: Diagnosis present

## 2022-02-18 DIAGNOSIS — E1122 Type 2 diabetes mellitus with diabetic chronic kidney disease: Secondary | ICD-10-CM | POA: Diagnosis present

## 2022-02-18 DIAGNOSIS — J439 Emphysema, unspecified: Secondary | ICD-10-CM | POA: Diagnosis present

## 2022-02-18 DIAGNOSIS — I5041 Acute combined systolic (congestive) and diastolic (congestive) heart failure: Secondary | ICD-10-CM | POA: Diagnosis not present

## 2022-02-18 DIAGNOSIS — I420 Dilated cardiomyopathy: Secondary | ICD-10-CM | POA: Diagnosis present

## 2022-02-18 DIAGNOSIS — I4892 Unspecified atrial flutter: Secondary | ICD-10-CM | POA: Diagnosis not present

## 2022-02-18 DIAGNOSIS — Z7901 Long term (current) use of anticoagulants: Secondary | ICD-10-CM | POA: Diagnosis not present

## 2022-02-18 DIAGNOSIS — I130 Hypertensive heart and chronic kidney disease with heart failure and stage 1 through stage 4 chronic kidney disease, or unspecified chronic kidney disease: Secondary | ICD-10-CM | POA: Diagnosis present

## 2022-02-18 DIAGNOSIS — I4510 Unspecified right bundle-branch block: Secondary | ICD-10-CM | POA: Diagnosis not present

## 2022-02-18 DIAGNOSIS — I5043 Acute on chronic combined systolic (congestive) and diastolic (congestive) heart failure: Secondary | ICD-10-CM | POA: Diagnosis present

## 2022-02-18 DIAGNOSIS — I429 Cardiomyopathy, unspecified: Secondary | ICD-10-CM | POA: Diagnosis not present

## 2022-02-18 DIAGNOSIS — I4891 Unspecified atrial fibrillation: Secondary | ICD-10-CM | POA: Diagnosis present

## 2022-02-18 LAB — COMPREHENSIVE METABOLIC PANEL
ALT: 16 U/L (ref 0–44)
AST: 23 U/L (ref 15–41)
Albumin: 3.4 g/dL — ABNORMAL LOW (ref 3.5–5.0)
Alkaline Phosphatase: 64 U/L (ref 38–126)
Anion gap: 13 (ref 5–15)
BUN: 15 mg/dL (ref 8–23)
CO2: 26 mmol/L (ref 22–32)
Calcium: 9.6 mg/dL (ref 8.9–10.3)
Chloride: 102 mmol/L (ref 98–111)
Creatinine, Ser: 1.39 mg/dL — ABNORMAL HIGH (ref 0.61–1.24)
GFR, Estimated: 55 mL/min — ABNORMAL LOW (ref >60)
Glucose, Bld: 103 mg/dL — ABNORMAL HIGH (ref 70–99)
Potassium: 4.4 mmol/L (ref 3.5–5.1)
Sodium: 141 mmol/L (ref 135–145)
Total Bilirubin: 0.7 mg/dL (ref 0.3–1.2)
Total Protein: 7.2 g/dL (ref 6.5–8.1)

## 2022-02-18 LAB — CBC
HCT: 39.6 % (ref 39.0–52.0)
Hemoglobin: 12.4 g/dL — ABNORMAL LOW (ref 13.0–17.0)
MCH: 31.4 pg (ref 26.0–34.0)
MCHC: 31.3 g/dL (ref 30.0–36.0)
MCV: 100.3 fL — ABNORMAL HIGH (ref 80.0–100.0)
Platelets: 243 10*3/uL (ref 150–400)
RBC: 3.95 MIL/uL — ABNORMAL LOW (ref 4.22–5.81)
RDW: 16.5 % — ABNORMAL HIGH (ref 11.5–15.5)
WBC: 9.7 10*3/uL (ref 4.0–10.5)
nRBC: 0 % (ref 0.0–0.2)

## 2022-02-18 LAB — ECHOCARDIOGRAM COMPLETE
AR max vel: 2.73 cm2
AV Area VTI: 2.72 cm2
AV Area mean vel: 2.72 cm2
AV Mean grad: 3 mmHg
AV Peak grad: 4.8 mmHg
Ao pk vel: 1.09 m/s
Area-P 1/2: 5.13 cm2
Est EF: 25
Height: 70 in
MV M vel: 3.87 m/s
MV Peak grad: 59.9 mmHg
S' Lateral: 4.6 cm
Weight: 4448 oz

## 2022-02-18 LAB — MAGNESIUM: Magnesium: 2.1 mg/dL (ref 1.7–2.4)

## 2022-02-18 MED ORDER — DAPAGLIFLOZIN PROPANEDIOL 10 MG PO TABS
10.0000 mg | ORAL_TABLET | Freq: Every day | ORAL | Status: DC
  Administered 2022-02-18 – 2022-02-21 (×4): 10 mg via ORAL
  Filled 2022-02-18 (×4): qty 1

## 2022-02-18 MED ORDER — SACUBITRIL-VALSARTAN 24-26 MG PO TABS
1.0000 | ORAL_TABLET | Freq: Two times a day (BID) | ORAL | Status: DC
  Administered 2022-02-18 – 2022-02-21 (×6): 1 via ORAL
  Filled 2022-02-18 (×6): qty 1

## 2022-02-18 MED ORDER — METOPROLOL SUCCINATE ER 25 MG PO TB24
25.0000 mg | ORAL_TABLET | Freq: Every day | ORAL | Status: DC
  Administered 2022-02-18 – 2022-02-21 (×4): 25 mg via ORAL
  Filled 2022-02-18 (×4): qty 1

## 2022-02-18 MED ORDER — FUROSEMIDE 10 MG/ML IJ SOLN
40.0000 mg | Freq: Every day | INTRAMUSCULAR | Status: AC
  Administered 2022-02-18 – 2022-02-19 (×2): 40 mg via INTRAVENOUS
  Filled 2022-02-18 (×2): qty 4

## 2022-02-18 NOTE — Progress Notes (Signed)
PROGRESS NOTE    Terry Beltran  HUD:[id number] DOB: April 13, 1955 DOA: 02/17/2022 PCP: Karie Mainland, FNP    Brief Narrative:  Terry Beltran is a 67 y.o. male with medical history significant of hepatitis C, hypertension, hyperlipidemia, non-Hodgkin's lymphoma status post chemotherapy in 2016 now in remission, presented hospital with shortness of breath for 2 weeks with edema more on the right than the left, shortness of breath on exertion and nonproductive cough..  Patient had gone to her primary care provider who had started on Lasix daily and add referred to cardiology.  Patient was scheduled to see a cardiologist but due to worsening symptoms decided to come to the hospital.  In the ED patient was noted to have atrial flutter with slightly elevated blood pressure and tachypnea.  Lab work showed creatinine at 1.2 near baseline at 1.2.  Troponin was mildly elevated but flat at 40-41.  D-dimer elevated at 1.6.  BNP elevated at 681.  Respiratory panel for flu COVID RSV negative.  Chest x-ray showed increased cardiac size possibly representing effusion, bilateral opacity which could represent congestion or atypical infection.  CTA PE study negative for PE but did show pleural scarring and emphysema in the apices, cardiomegaly with contrast reflux possibly representing right heart failure.  No evidence of pneumonia or pulmonary edema.  No pericardial effusion.  Patient received Lasix, Lovenox mg 40 mill equivalents p.o. potassium in the ED and was considered for admission to hospital for further evaluation and treatment.   Assessment and Plan:  New onset acute CHF No documented history of congestive heart failure.  Patient had worsening shortness of breath dyspnea on exertion peripheral edema and elevated BNP with a CT scan showing pulmonary vascular congestion.  Currently on 40 IV Lasix twice daily, continue strict intake and output charting, daily weights, check 2D echocardiogram.  Continue  metoprolol from home.  Will consult cardiology.  Check lower extremity ultrasound.   New onset atrial flutter Noted to have new onset atrial flutter on EKG in the ED. rate controlled at this time.  Was given Lovenox in the ED.  Has been subsequently changed to Eliquis.  Will consult cardiology.  Hypertension Continue amlodipine metoprolol from home.  Continue IV Lasix.  Hyperlipidemia Continue Lipitor.   History of non-Hodgkin's lymphoma In remission currently.  In 2016 status post multiple cycles of CHOP, follows with [facility]ology.    DVT prophylaxis:  apixaban (ELIQUIS) tablet 5 mg   Code Status:     Code Status: Full Code  Disposition: Home likely in 2 to 3 days.  Status is: Observation  The patient will require care spanning > 2 midnights and should be moved to inpatient because: IV diuretics, congestive heart failure, cardiology consultation.   Family Communication: Spoke with the patient's daughter at bedside.  Consultants:  Will consult cardiology.  Procedures:  2D echocardiogram  Antimicrobials:  None  Anti-infectives (From admission, onward)    None      Subjective: Today, patient was seen and examined at bedside.  Patient complains of less shortness of breath today but still has some mild cough.  Has dyspnea on exertion.  Denies any chest pain.  Objective: Vitals:   02/18/22 0026 02/18/22 0400 02/18/22 0500 02/18/22 0819  BP: 124/78  (!) 131/92 (!) 135/95  Pulse:    (!) 111  Resp:  20  19  Temp: 98.6 F (37 C) 97.7 F (36.5 C)  97.7 F (36.5 C)  TempSrc: Oral Oral  Oral  SpO2:    98%  Weight:   126.1 kg   Height:        Intake/Output Summary (Last 24 hours) at 02/18/2022 1058 Last data filed at 02/18/2022 0959 Gross per 24 hour  Intake 410 ml  Output 2600 ml  Net -2190 ml   Filed Weights   02/17/22 1135 02/17/22 1557 02/18/22 0500  Weight: 132 kg 128 kg 126.1 kg    Physical Examination: Body mass index is 39.89 kg/m.   General:  Obese built, not in obvious distress HENT:   No scleral pallor or icterus noted. Oral mucosa is moist.  Chest:    Diminished breath sounds bilaterally.  Mild rhonchi. CVS: S1 &S2 heard. No murmur.  Irregular rhythm. Abdomen: Soft, nontender, nondistended.  Bowel sounds are heard.   Extremities: No cyanosis, clubbing with bilateral lower extremity edema.  Peripheral pulses are palpable. Psych: Alert, awake and oriented, normal mood CNS:  No cranial nerve deficits.  Power equal in all extremities.   Skin: Warm and dry.  No rashes noted.  Data Reviewed:   CBC: Recent Labs  Lab 02/17/22 1113 02/18/22 0028  WBC 8.7 9.7  NEUTROABS 6.2  --   HGB 12.2* 12.4*  HCT 37.7* 39.6  MCV 98.4 100.3*  PLT 248 245    Basic Metabolic Panel: Recent Labs  Lab 02/17/22 1113 02/18/22 0028  NA 137 141  K 3.8 4.4  CL 105 102  CO2 25 26  GLUCOSE 192* 103*  BUN 16 15  CREATININE 1.29* 1.39*  CALCIUM 8.8* 9.6  MG 1.7 2.1    Liver Function Tests: Recent Labs  Lab 02/18/22 0028  AST 23  ALT 16  ALKPHOS 64  BILITOT 0.7  PROT 7.2  ALBUMIN 3.4*     Radiology Studies: CT Angio Chest PE W/Cm &/Or Wo Cm  Result Date: 02/17/2022 CLINICAL DATA:  Shortness of breath and swelling of the right leg. EXAM: CT ANGIOGRAPHY CHEST WITH CONTRAST TECHNIQUE: Multidetector CT imaging of the chest was performed using the standard protocol during bolus administration of intravenous contrast. Multiplanar CT image reconstructions and MIPs were obtained to evaluate the vascular anatomy. RADIATION DOSE REDUCTION: This exam was performed according to the departmental dose-optimization program which includes automated exposure control, adjustment of the mA and/or kV according to patient size and/or use of iterative reconstruction technique. CONTRAST:  83m OMNIPAQUE IOHEXOL 350 MG/ML SOLN COMPARISON:  CT examination dated May 07, 2013 FINDINGS: Cardiovascular: Satisfactory opacification of the pulmonary arteries to  the segmental level. No evidence of pulmonary embolism. Normal enlarged. No pericardial effusion. Reflux of contrast into the hepatic veins suggesting right heart failure. Mediastinum/Nodes: No enlarged mediastinal, hilar, or axillary lymph nodes. Thyroid gland, trachea, and esophagus demonstrate no significant findings. Lungs/Pleura: Biapical pleural/parenchymal scarring with large subpleural and centrilobular emphysematous bulla. Small bilateral pleural effusions, right greater than the left. Mild bibasilar atelectasis. No evidence of pneumonia. Upper Abdomen: No acute abnormality. Prominent left adrenal gland suggesting adrenal hyperplasia. Musculoskeletal: Degenerate disc disease with prominent anterior osteophytes. No acute osseous abnormality Review of the MIP images confirms the above findings. IMPRESSION: 1. No evidence of pulmonary embolism. 2. Small bilateral pleural effusions, right greater than left with associated bibasilar atelectasis. 3. Biapical pleural/parenchymal scarring with large subpleural and centrilobular emphysematous bulla. 4. Heart is enlarged. Reflux of contrast into the hepatic veins suggesting right heart failure. 5. No evidence of pneumonia or pulmonary edema. 6. 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 No acute osseous abnormality. 7. Prominent left adrenal gland suggesting adrenal hyperplasia. Emphysema (ICD10-J43.9).  Electronically Signed   By: Keane Police D.O.   On: 02/17/2022 13:19   DG Chest Port 1 View  Result Date: 02/17/2022 CLINICAL DATA:  Shortness of breath. EXAM: PORTABLE CHEST 1 VIEW COMPARISON:  CXR 02/11/21 FINDINGS: No pleural effusion. No pneumothorax. Compared to prior exam there is interval increase in size of the cardiac contours, which could suggest presence of a pericardial effusion. There are prominent bilateral interstitial opacities, suggestive of either atypical infection or pulmonary venous congestion. No radiographically apparent displaced rib  fractures. No focal airspace opacity. IMPRESSION: 1. Compared to prior exam there is interval increase in size of the cardiac contours, which could suggest presence of a pericardial effusion. Correlate with echocardiography. 2. Prominent bilateral interstitial opacities, suggestive of either atypical infection or pulmonary venous congestion. Electronically Signed   By: Marin Roberts M.D.   On: 02/17/2022 11:34      LOS: 0 days    Flora Lipps, MD [facility] Available via Epic secure chat 7am-7pm After these hours, please refer to coverage provider listed on [email or web address] 02/18/2022, 10:58 AM

## 2022-02-18 NOTE — TOC Progression Note (Signed)
Transition of Care [facility]) - Progression Note    Patient Details  Name: Terry Beltran MRN: [id number] Date of Birth: 12/29/1955  Transition of Care [facility] CM/SW Contact  Zenon Mayo, RN Phone Number: 02/18/2022, 4:37 PM  Clinical Narrative:      CHF Ex, from home, indep, for echo and DVT study today, conts on iv lasix, on eliquis.  TOC following.       Expected Discharge Plan and Services                                               Social Determinants of Health (SDOH) Interventions SDOH Screenings   Food Insecurity: No Food Insecurity (02/17/2022)  Housing: Low Risk  (02/17/2022)  Transportation Needs: No Transportation Needs (02/17/2022)  Utilities: Not At Risk (02/17/2022)  Tobacco Use: High Risk (02/17/2022)    Readmission Risk Interventions     No data to display

## 2022-02-18 NOTE — TOC Benefit Eligibility Note (Signed)
Patient Teacher, English as a foreign language completed.    The patient is currently admitted and upon discharge could be taking Eliquis 5 mg.  The current 30 day co-pay is $0.00.   The patient is currently admitted and upon discharge could be taking Entresto 24-26 mg.  The current 30 day co-pay is $0.00.   The patient is currently admitted and upon discharge could be taking Farxiga 10 mg.  The current 30 day co-pay is $0.00.   The patient is currently admitted and upon discharge could be taking Jardiance 10 mg.  The current 30 day co-pay is $0.00.   The patient is insured through [employer] Patient Advocate Specialist [place] Patient Advocate Team Direct Number: [phone]  Fax: [phone]

## 2022-02-18 NOTE — Progress Notes (Signed)
PT scored a yellow MEWS score of 2 driven by tachycardia. PT is in a variable conduction A. Flutter. PT for this episode will be receiving his scheduled PO Metoprolol which was effective at rate control. PT otherwise in no outward acute distress and feels his breathing is much improved from admission after diuresis. Repeat MEWS was a GREEN.    02/18/22 0819  Assess: MEWS Score  Temp 97.7 F (36.5 C)  BP (!) 135/95  MAP (mmHg) 106  Pulse Rate (!) 111  Resp 19  SpO2 98 %  O2 Device Room Air  Assess: MEWS Score  MEWS Temp 0  MEWS Systolic 0  MEWS Pulse 2  MEWS RR 0  MEWS LOC 0  MEWS Score 2  MEWS Score Color Yellow  Assess: if the MEWS score is Yellow or Red  Were vital signs taken at a resting state? Yes  Focused Assessment No change from prior assessment  Does the patient meet 2 or more of the SIRS criteria? No  MEWS guidelines implemented *See Row Information* No, vital signs rechecked  Treat  MEWS Interventions Administered scheduled meds/treatments  Pain Scale 0-10  Pain Score 0  Document  Patient Outcome Stabilized after interventions  Assess: SIRS CRITERIA  SIRS Temperature  0  SIRS Pulse 1  SIRS Respirations  0  SIRS WBC 0  SIRS Score Sum  1

## 2022-02-18 NOTE — Progress Notes (Signed)
Mobility Specialist - Progress Note   02/18/22 1100  Mobility  Activity Ambulated with assistance in hallway  Level of Assistance Contact guard assist, steadying assist  Assistive Device None  Distance Ambulated (ft) 100 ft  Activity Response Tolerated well  Mobility Referral Yes  $Mobility charge 1 Mobility    Pt received sitting EOB and agreeable. Took standing break x1, distance limited by c/o fatigue and SOB. Encouraged PLB and rest breaks. Left in chair in room w/ NT present at his side.    Bent Specialist Please contact via SecureChat or Rehab office at [phone]

## 2022-02-18 NOTE — Progress Notes (Signed)
Patient asleep. Respirations even and unlabored. No signs of distress.

## 2022-02-18 NOTE — Consult Note (Signed)
CARDIOLOGY CONSULT NOTE  Patient ID: Terry Beltran MRN: [id number] DOB/AGE: 69-Dec-1957 69 y.o.  Admit date: 02/17/2022 Referring Physician  Flora Lipps, MD Primary Physician:  Karie Mainland, [place] Reason for Consultation  New onset atypical atrial flutter and dyspnea  Patient ID: Terry Beltran, male    DOB: 1955-05-21, 67 y.o.   MRN: [id number]  Chief Complaint  Patient presents with   Shortness of Breath   HPI:    Mattthew Beltran  is a 67 y.o. Male patient with prior history of hepatitis C, primary hypertension, hypercholesterolemia, history of non-Hodgkin's lymphoma status post chemotherapy in 2016 and in remission, admitted to the hospital with worsening dyspnea, leg edema, new onset heart failure and EKG revealing atypical atrial flutter with controlled ventricular response.  Patient states that over the past 3 to 4 weeks he has noticed gradually worsening shortness of breath, orthopnea, gradually worsening bilateral leg edema right worse than the left.  Over the past 2 to 3 days, symptoms got worse, although he had an appointment to see cardiology in 10 days now with Dr. Maple Hudson in [facility], he could not wait and presented to the emergency room.  Patient admits to loud snoring, witnessed apneic episodes.  Patient's wife admits patient has very poor eating habits.  He also smokes about 1/2 pack of cigarettes a day.  No history to suggest alcohol abuse.  No prior cardiac issues.  Past Medical History:  Diagnosis Date   Cancer [facility])    Hypertension    History reviewed. No pertinent surgical history. Social History   Tobacco Use   Smoking status: Some Days    Types: Cigarettes   Smokeless tobacco: Never  Substance Use Topics   Alcohol use: Not Currently    History reviewed. No pertinent family history.  Marital Status: Married  ROS  Review of Systems  Cardiovascular:  Positive for dyspnea on exertion, leg swelling and orthopnea. Negative for chest pain and  palpitations.  Respiratory:  Positive for snoring.   Musculoskeletal:  Positive for back pain.   Objective      02/18/2022    8:19 AM 02/18/2022    5:00 AM 02/18/2022   12:26 AM  Vitals with BMI  Weight  278 lbs   BMI  60.10   Systolic 932 355 732  Diastolic 95 92 78  Pulse 202      Blood pressure (!) 135/95, pulse (!) 111, temperature 97.7 F (36.5 C), temperature source Oral, resp. rate 19, height '5\' 10"'$  (1.778 m), weight 126.1 kg, SpO2 98 %.    Physical Exam Neck:     Vascular: JVD present. No carotid bruit.  Cardiovascular:     Rate and Rhythm: Normal rate. Rhythm irregular.     Pulses: Normal pulses and intact distal pulses.     Heart sounds: Heart sounds are distant. No murmur heard. Pulmonary:     Effort: Pulmonary effort is normal.     Breath sounds: Normal breath sounds.  Abdominal:     General: Bowel sounds are normal.     Palpations: Abdomen is soft.  Musculoskeletal:     Right lower leg: Edema (2+ pitting below knee) present.     Left lower leg: Edema (2+ pitting below knee) present.  Skin:    Capillary Refill: Capillary refill takes less than 2 seconds.    Laboratory examination:   Recent Labs    02/17/22 1113 02/18/22 0028  NA 137 141  K 3.8 4.4  CL 105 102  CO2 25  26  GLUCOSE 192* 103*  BUN 16 15  CREATININE 1.29* 1.39*  CALCIUM 8.8* 9.6  GFRNONAA >60 55*   estimated creatinine clearance is 67.8 mL/min (A) (by C-G formula based on SCr of 1.39 mg/dL (H)).     Latest Ref Rng & Units 02/18/2022   12:28 AM 02/17/2022   11:13 AM 02/11/2021   11:26 AM  CMP  Glucose 70 - 99 mg/dL 103  192  119   BUN 8 - 23 mg/dL '15  16  17   '$ Creatinine 0.61 - 1.24 mg/dL 1.39  1.29  1.17   Sodium 135 - 145 mmol/L 141  137  136   Potassium 3.5 - 5.1 mmol/L 4.4  3.8  3.5   Chloride 98 - 111 mmol/L 102  105  99   CO2 22 - 32 mmol/L '26  25  27   '$ Calcium 8.9 - 10.3 mg/dL 9.6  8.8  10.3   Total Protein 6.5 - 8.1 g/dL 7.2   8.5   Total Bilirubin 0.3 - 1.2 mg/dL 0.7   0.9    Alkaline Phos 38 - 126 U/L 64   68   AST 15 - 41 U/L 23   32   ALT 0 - 44 U/L 16   35       Latest Ref Rng & Units 02/18/2022   12:28 AM 02/17/2022   11:13 AM 02/11/2021   11:04 AM  CBC  WBC 4.0 - 10.5 K/uL 9.7  8.7  14.6   Hemoglobin 13.0 - 17.0 g/dL 12.4  12.2  15.4   Hematocrit 39.0 - 52.0 % 39.6  37.7  45.7   Platelets 150 - 400 K/uL 243  248  336    Lipid Panel No results for input(s): "CHOL", "TRIG", "LDLCALC", "VLDL", "HDL", "CHOLHDL", "LDLDIRECT" in the last 8760 hours.  HEMOGLOBIN A1C No results found for: "HGBA1C", "MPG" TSH Recent Labs    02/17/22 1114  TSH 1.591   BNP (last 3 results) Recent Labs    02/17/22 1113  BNP 681.8*   Cardiac Panel (last 3 results) Recent Labs    02/17/22 1113 02/17/22 1313  TROPONINIHS 40* 41*     Medications and allergies  No Known Allergies   Current Meds  Medication Sig   amLODipine (NORVASC) 10 MG tablet Take 1 tablet by mouth daily.   atorvastatin (LIPITOR) 20 MG tablet Take 1 tablet by mouth daily.   furosemide (LASIX) 20 MG tablet Take 20 mg by mouth daily.   gabapentin (NEURONTIN) 300 MG capsule Take 1 capsule by mouth at bedtime.   meloxicam (MOBIC) 15 MG tablet Take 15 mg by mouth daily.   metoprolol tartrate (LOPRESSOR) 25 MG tablet Take 25 mg by mouth 2 (two) times daily.    Scheduled Meds:  amLODipine  10 mg Oral Daily   apixaban  5 mg Oral BID   atorvastatin  20 mg Oral Daily   furosemide  40 mg Intravenous BID   gabapentin  300 mg Oral QHS   metoprolol tartrate  25 mg Oral BID   sodium chloride flush  3 mL Intravenous Q12H   Continuous Infusions: PRN Meds:.acetaminophen **OR** acetaminophen, ipratropium-albuterol, polyethylene glycol   I/O last 3 completed shifts: In: 290 [P.O.:240; IV Piggyback:50] Out: 2094 [BSJGG:[id number]] Total I/O In: 120 [P.O.:120] Out: 775 [Urine:775]  Net IO Since Admission: -2,190 mL [02/18/22 1213]   Radiology:   CT angiogram chest PE protocol 02/17/2022: 1. No evidence  of pulmonary embolism.  2. Small  bilateral pleural effusions, right greater than left with associated bibasilar atelectasis.  3. Biapical pleural/parenchymal scarring with large subpleural and centrilobular emphysematous bulla.  4. Heart is enlarged. Reflux of contrast into the hepatic veins suggesting right heart failure.  5. No evidence of pneumonia or pulmonary edema.  6. 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 No acute osseous abnormality.  7. Prominent left adrenal gland suggesting adrenal hyperplasia. 8. 8. Biapical pleural/parenchymal scarring with large subpleural and centrilobular emphysematous bulla. Small bilateral pleural effusions, right greater than the left.  DG Chest Port 1 View 02/17/2022 CLINICAL DATA:  Shortness of breath. EXAM: PORTABLE CHEST 1 VIEW COMPARISON:  CXR 02/11/21 FINDINGS: No pleural effusion. No pneumothorax. Compared to prior exam there is interval increase in size of the cardiac contours, which could suggest presence of a pericardial effusion. There are prominent bilateral interstitial opacities, suggestive of either atypical infection or pulmonary venous congestion. No radiographically apparent displaced rib fractures. No focal airspace opacity.  Cardiac Studies:   Echocardiogram 02/18/2022:  1. Left ventricular ejection fraction, by estimation, is 25%. The left ventricle has severely decreased function. The left ventricle demonstrates global hypokinesis. The left ventricular internal cavity size was mildly dilated. Left ventricular  diastolic parameters are indeterminate.  2. Right ventricular systolic function is moderately reduced. The right ventricular size is mildly enlarged. There is mildly elevated pulmonary artery systolic pressure. The estimated right ventricular systolic pressure is 21.3 mmHg.  3. Left atrial size was mildly dilated.  4. Right atrial size was moderately dilated.  5. The mitral valve is normal in structure. Mild mitral valve  regurgitation. No evidence of mitral stenosis.  6. Tricuspid valve regurgitation is moderate.  7. The aortic valve is tricuspid. There is mild calcification of the aortic valve. Aortic valve regurgitation is trivial. No aortic stenosis is present.  8. Aortic dilatation noted. There is mild dilatation of the aortic root, measuring 37 mm.  9. The inferior vena cava is dilated in size with <50% respiratory variability, suggesting right atrial pressure of 15 mmHg.  EKG:  EKG 02/17/2022: Atypical atrial flutter with variable AV conduction at rate of 64 bpm, right bundle branch block.  Low-voltage complexes.  Single PVC.  Compared to 03/11/2019, atypical atrial flutter is new.  Assessment   1.  Acute systolic and diastolic heart failure 2.  Dilated cardiomyopathy 3.  New onset atypical atrial flutter/fibrillation with controlled ventricular response CHA2DS2-VASc Score is 3.  Yearly risk of stroke: 3.2% (A, HTN, CHF).  Score of 1=0.6; 2=2.2; 3=3.2; 4=4.8; 5=7.2; 6=9.8; 7=>9.8) -(CHF; HTN; vasc disease DM,  Male = 1; Age <65 =0; 65-74 = 1,  >75 =2; stroke/embolism= 2).   4.  Primary hypertension 5.  Tobacco use disorder 6.  Low snoring and witnessed apneic episodes 7.  Moderate obesity  Recommendations:   Skippy Marhefka is a 67 y.o. Male patient with prior history of hepatitis C, primary hypertension, hypercholesterolemia, history of non-Hodgkin's lymphoma status post chemotherapy in 2016 and in remission, admitted to the hospital with worsening dyspnea, leg edema, new onset heart failure and EKG revealing atypical atrial flutter with controlled ventricular response.  Patient needs evaluation for heart failure there is new onset, suspect nonischemic cardiomyopathy although coronary artery disease need to be excluded.  Once heart failure is compensated, we could either consider right and left heart catheterization or outpatient stress testing.  Suspect cardiomyopathy was precipitated by A-fib.   Duration probably >3 to 4 weeks probably much longer.  Will continue with twice daily dosing  of furosemide today however will initiate therapy with Farxiga 10 mg daily, change metoprolol to tartrate to metoprolol succinate 50 mg daily, added Entresto 24/26 mg twice daily, continue Eliquis.  Will consider cardioversion in 3 to 4 weeks.  Patient has very classic symptoms suggestive of OSA and will need evaluation for the same.  With above management, hopefully blood pressure will also be well-controlled.  He does have hyperglycemia, will check A1c along with TSH and lipids will be checked as well.  Discussed moderate obesity, I called his wife over the telephone and discussed regarding calorie restriction, heart failure was discussed in detail with the patient and his wife, he has extremely poor eating habits and his diet is very unhealthy, we discussed healthy options.  Caloric reduction discussed.  Salt restriction discussed.  Thank you for consulting with [place], will continue to follow.   Adrian Prows, MD, [facility] 02/18/2022, 12:13 PM Office: [phone]

## 2022-02-18 NOTE — Progress Notes (Signed)
Heart Failure Navigator Progress Note  Assessed for Heart & Vascular TOC clinic readiness.  Patient does not meet criteria due to [facility] was consulted. .   Navigator will sign off at this time.    Earnestine Leys, BSN, Clinical cytogeneticist Only

## 2022-02-18 NOTE — Progress Notes (Signed)
RLE venous duplex has been completed.   Results can be found under chart review under CV PROC. 02/18/2022 3:54 PM Ellenor Wisniewski RVT, RDMS

## 2022-02-18 NOTE — Progress Notes (Signed)
Echocardiogram 2D Echocardiogram has been performed.  Terry Beltran 02/18/2022, 12:31 PM

## 2022-02-19 DIAGNOSIS — Z79899 Other long term (current) drug therapy: Secondary | ICD-10-CM

## 2022-02-19 DIAGNOSIS — I429 Cardiomyopathy, unspecified: Secondary | ICD-10-CM

## 2022-02-19 DIAGNOSIS — R7989 Other specified abnormal findings of blood chemistry: Secondary | ICD-10-CM

## 2022-02-19 DIAGNOSIS — I4892 Unspecified atrial flutter: Secondary | ICD-10-CM | POA: Diagnosis not present

## 2022-02-19 DIAGNOSIS — I10 Essential (primary) hypertension: Secondary | ICD-10-CM | POA: Insufficient documentation

## 2022-02-19 DIAGNOSIS — Z7901 Long term (current) use of anticoagulants: Secondary | ICD-10-CM

## 2022-02-19 DIAGNOSIS — I5041 Acute combined systolic (congestive) and diastolic (congestive) heart failure: Secondary | ICD-10-CM

## 2022-02-19 LAB — BASIC METABOLIC PANEL
Anion gap: 12 (ref 5–15)
BUN: 15 mg/dL (ref 8–23)
CO2: 26 mmol/L (ref 22–32)
Calcium: 9.2 mg/dL (ref 8.9–10.3)
Chloride: 100 mmol/L (ref 98–111)
Creatinine, Ser: 1.35 mg/dL — ABNORMAL HIGH (ref 0.61–1.24)
GFR, Estimated: 57 mL/min — ABNORMAL LOW (ref >60)
Glucose, Bld: 101 mg/dL — ABNORMAL HIGH (ref 70–99)
Potassium: 3.4 mmol/L — ABNORMAL LOW (ref 3.5–5.1)
Sodium: 138 mmol/L (ref 135–145)

## 2022-02-19 LAB — LIPID PANEL
Cholesterol: 119 mg/dL (ref 0–200)
HDL: 29 mg/dL — ABNORMAL LOW (ref >40)
LDL Cholesterol: 73 mg/dL (ref 0–99)
Total CHOL/HDL Ratio: 4.1 RATIO
Triglycerides: 86 mg/dL (ref <150)
VLDL: 17 mg/dL (ref 0–40)

## 2022-02-19 LAB — TSH: TSH: 2.234 u[IU]/mL (ref 0.350–4.500)

## 2022-02-19 LAB — HEMOGLOBIN A1C
Hgb A1c MFr Bld: 5.8 % — ABNORMAL HIGH (ref 4.8–5.6)
Mean Plasma Glucose: 119.76 mg/dL

## 2022-02-19 LAB — BRAIN NATRIURETIC PEPTIDE: B Natriuretic Peptide: 791.5 pg/mL — ABNORMAL HIGH (ref 0.0–100.0)

## 2022-02-19 MED ORDER — AMIODARONE HCL IN DEXTROSE 360-4.14 MG/200ML-% IV SOLN
60.0000 mg/h | INTRAVENOUS | Status: AC
  Administered 2022-02-19 (×2): 60 mg/h via INTRAVENOUS
  Filled 2022-02-19 (×2): qty 200

## 2022-02-19 MED ORDER — BUMETANIDE 0.25 MG/ML IJ SOLN
1.0000 mg | Freq: Two times a day (BID) | INTRAMUSCULAR | Status: DC
  Administered 2022-02-19 (×2): 1 mg via INTRAVENOUS
  Filled 2022-02-19 (×3): qty 4

## 2022-02-19 MED ORDER — AMIODARONE LOAD VIA INFUSION
150.0000 mg | Freq: Once | INTRAVENOUS | Status: AC
  Administered 2022-02-19: 150 mg via INTRAVENOUS
  Filled 2022-02-19: qty 83.34

## 2022-02-19 MED ORDER — SPIRONOLACTONE 25 MG PO TABS
25.0000 mg | ORAL_TABLET | Freq: Every day | ORAL | Status: DC
  Administered 2022-02-19 – 2022-02-20 (×2): 25 mg via ORAL
  Filled 2022-02-19 (×2): qty 1

## 2022-02-19 MED ORDER — POTASSIUM CHLORIDE CRYS ER 20 MEQ PO TBCR
40.0000 meq | EXTENDED_RELEASE_TABLET | ORAL | Status: AC
  Administered 2022-02-19 (×2): 40 meq via ORAL
  Filled 2022-02-19 (×2): qty 2

## 2022-02-19 MED ORDER — AMIODARONE HCL IN DEXTROSE 360-4.14 MG/200ML-% IV SOLN
30.0000 mg/h | INTRAVENOUS | Status: DC
  Administered 2022-02-20 (×2): 30 mg/h via INTRAVENOUS
  Filled 2022-02-19 (×2): qty 200

## 2022-02-19 NOTE — Progress Notes (Addendum)
PROGRESS NOTE    Terry Beltran  NLG:[id number] DOB: [birth year] DOA: 02/17/2022 PCP: Karie Mainland, FNP    Brief Narrative:  Terry Beltran is a 67 y.o. male with medical history significant of hepatitis C, hypertension, hyperlipidemia, non-Hodgkin's lymphoma status post chemotherapy in 2016 now in remission, presented hospital with shortness of breath for 2 weeks with edema more on the right than the left, shortness of breath on exertion and nonproductive cough.  Patient had gone to her primary care provider who had started on Lasix daily and was referred to cardiology.  Patient was scheduled to see a cardiologist but due to worsening symptoms decided to come to the hospital.  In the ED, patient was noted to have atrial flutter with slightly elevated blood pressure and tachypnea.  Lab work showed creatinine at 1.2 near baseline at 1.2.  Troponin was mildly elevated but flat at 40-41.  D-dimer elevated at 1.6.  BNP elevated at 681.  Respiratory panel for flu, COVID, RSV was negative.  Chest x-ray showed increased cardiac size possibly representing effusion, bilateral opacity which could represent congestion or atypical infection.  CTA PE study negative for PE but did show pleural scarring and emphysema in the apices, cardiomegaly with contrast reflux possibly representing right heart failure.  No evidence of pneumonia or pulmonary edema.  No pericardial effusion.  Patient received Lasix, Lovenox, p.o. potassium in the ED and was considered for admission to hospital for further evaluation and treatment.   Assessment and Plan:  New onset acute CHF No documented history of congestive heart failure.  Patient had worsening shortness of breath dyspnea on exertion peripheral edema and elevated BNP with a CT scan showing pulmonary vascular congestion.  Received IV Lasix initially.  Patient has been seen by cardiology and has been started on Farxiga, Entresto.  Continue strict intake and output charting,  daily weights, echocardiogram from 02/18/2022 showed LV ejection fraction of 25% with global hypokinesis.  There is elevated pulmonary arterial pressure at 40 mmHg.  Metoprolol has been changed to metoprolol succinate 50 daily.  Lower extremity duplex ultrasound was negative for DVT.  Cardiology has plans for cardiac catheterization versus outpatient ischemic workup with stress test once compensated.  Patient still has peripheral edema.  Patient did receive 40 mg of Lasix twice yesterday.  Further diuretic as per cardiology.  Blood cultures negative in 2 days.   New diagnosis of atrial flutter Noted to have new onset atrial flutter on EKG in the ED. rate controlled at this time.  Was given Lovenox in the ED.  Has been subsequently changed to Eliquis.  Cardiology on board at this time.  Follow cardiology recommendation.  2D echocardiogram with reduced LV function.  Plan for cardioversion in 3 to 4 weeks as per cardiology.  Patient has been started on amiodarone drip  Hypertension Continue amlodipine, metoprolol from home.  Patient has been started on Entresto as well.    Hyperlipidemia Continue Lipitor.  Hypokalemia.  Potassium of 3.4 today.  Will replenish orally.  Check levels in AM.   History of non-Hodgkin's lymphoma In remission currently.  In 2016 status post multiple cycles of CHOP, follows with [facility].    DVT prophylaxis:  apixaban (ELIQUIS) tablet 5 mg   Code Status:     Code Status: Full Code  Disposition:  Home likely in 1 to 2 days when okay with cardiology.  Status is: Inpatient  The patient is inpatient because: Decompensated congestive heart failure, heart failure medication initiation, cardiology consultation, amiodarone drip.  Family Communication:  None at bedside.  Consultants:  Cardiology  Procedures:  2D echocardiogram  Antimicrobials:  None  Anti-infectives (From admission, onward)    None      Subjective: Today, patient was seen and  examined at bedside.  Patient feels a little better with breathing but still has some shortness of breath while ambulating in process to have lower extremity edema.  Was able to sleep a little better.  Objective: Vitals:   02/18/22 2012 02/19/22 0034 02/19/22 0605 02/19/22 0722  BP: (!) 107/91 119/83 102/83 123/86  Pulse: 65 72 79 68  Resp: '18 18 18 18  '$ Temp: 97.7 F (36.5 C) 97.7 F (36.5 C) 97.7 F (36.5 C) 98 F (36.7 C)  TempSrc: Oral Oral Oral Oral  SpO2:  90% 90% 90%  Weight:   121.7 kg   Height:        Intake/Output Summary (Last 24 hours) at 02/19/2022 0823 Last data filed at 02/19/2022 2353 Gross per 24 hour  Intake 360 ml  Output 3150 ml  Net -2790 ml    Filed Weights   02/17/22 1557 02/18/22 0500 02/19/22 0605  Weight: 128 kg 126.1 kg 121.7 kg    Physical Examination: Body mass index is 38.48 kg/m.   General: Alert awake and Communicative, obese built HENT:   No scleral pallor or icterus noted. Oral mucosa is moist.  Chest:    Diminished breath sounds noted bilaterally. CVS: S1 &S2 heard. No murmur.  Irregular rhythm. Abdomen: Soft, nontender, nondistended.  Bowel sounds are heard.   Extremities: No cyanosis, clubbing with bilateral pitting pedal edema. Psych: Alert, awake and oriented, normal mood CNS:  No cranial nerve deficits.  Power equal in all extremities.   Skin: Warm and dry.  No rashes noted.  Data Reviewed:   CBC: Recent Labs  Lab 02/17/22 1113 02/18/22 0028  WBC 8.7 9.7  NEUTROABS 6.2  --   HGB 12.2* 12.4*  HCT 37.7* 39.6  MCV 98.4 100.3*  PLT 248 243     Basic Metabolic Panel: Recent Labs  Lab 02/17/22 1113 02/18/22 0028 02/19/22 0040  NA 137 141 138  K 3.8 4.4 3.4*  CL 105 102 100  CO2 '25 26 26  '$ GLUCOSE 192* 103* 101*  BUN '16 15 15  '$ CREATININE 1.29* 1.39* 1.35*  CALCIUM 8.8* 9.6 9.2  MG 1.7 2.1  --      Liver Function Tests: Recent Labs  Lab 02/18/22 0028  AST 23  ALT 16  ALKPHOS 64  BILITOT 0.7  PROT 7.2   ALBUMIN 3.4*      Radiology Studies: VAS [place] LOWER EXTREMITY VENOUS (DVT)  Result Date: 02/18/2022  Lower Venous DVT Study Patient Name:  Terry Beltran  Date of Exam:   02/18/2022 Medical Rec #: [id number]         Accession #:    [id number] Date of Birth: [birth year]         Patient Gender: M Patient Age:   54 years Exam Location:  [facility] Procedure:      VAS [place] LOWER EXTREMITY VENOUS (DVT) Referring Phys: Sheppard Coil MELVIN --------------------------------------------------------------------------------  Indications: Edema.  Comparison Study: No previous exams Performing Technologist: Jody Hill RVT, RDMS  Examination Guidelines: A complete evaluation includes B-mode imaging, spectral Doppler, color Doppler, and power Doppler as needed of all accessible portions of each vessel. Bilateral testing is considered an integral part of a complete examination. Limited examinations for reoccurring indications may be performed as noted. The reflux  portion of the exam is performed with the patient in reverse Trendelenburg.  +---------+---------------+---------+-----------+----------+-------------------+ RIGHT    CompressibilityPhasicitySpontaneityPropertiesThrombus Aging      +---------+---------------+---------+-----------+----------+-------------------+ CFV      Full           No       Yes                                      +---------+---------------+---------+-----------+----------+-------------------+ SFJ      Full                                                             +---------+---------------+---------+-----------+----------+-------------------+ FV Prox  Full           No       Yes                                      +---------+---------------+---------+-----------+----------+-------------------+ FV Mid   Full           No       Yes                                      +---------+---------------+---------+-----------+----------+-------------------+ FV  DistalFull           No       Yes                                      +---------+---------------+---------+-----------+----------+-------------------+ PFV      Full                                                             +---------+---------------+---------+-----------+----------+-------------------+ POP      Full           No       Yes                                      +---------+---------------+---------+-----------+----------+-------------------+ PTV      Full                                                             +---------+---------------+---------+-----------+----------+-------------------+ PERO     Full                                         Not well visualized +---------+---------------+---------+-----------+----------+-------------------+ pulsatile flow  +----+---------------+---------+-----------+----------+--------------+ LEFTCompressibilityPhasicitySpontaneityPropertiesThrombus Aging +----+---------------+---------+-----------+----------+--------------+ CFV Full  No       Yes                                 +----+---------------+---------+-----------+----------+--------------+ pulsatile flow    Summary: RIGHT: - There is no evidence of deep vein thrombosis in the lower extremity.  - No cystic structure found in the popliteal fossa.  LEFT: - No evidence of common femoral vein obstruction.  *See table(s) above for measurements and observations. Electronically signed by Harold Barban MD on 02/18/2022 at 10:09:38 PM.    Final    ECHOCARDIOGRAM COMPLETE  Result Date: 02/18/2022    ECHOCARDIOGRAM REPORT   Patient Name:   TAGE FEGGINS Date of Exam: 02/18/2022 Medical Rec #:  [id number]        Height:       70.0 in Accession #:    [id number]       Weight:       278.0 lb Date of Birth:  1955/08/16        BSA:          2.401 m Patient Age:    75 years         BP:           135/95 mmHg Patient Gender: M                HR:           62 bpm.  Exam Location:  Inpatient Procedure: 2D Echo, Cardiac Doppler and Color Doppler Indications:    CHF-Acute Systolic D32.20  History:        Patient has no prior history of Echocardiogram examinations.                 CHF, Arrythmias:Atrial Flutter; Risk Factors:Hypertension and                 Dyslipidemia. Cancer.  Sonographer:    Ronny Flurry Referring Phys: [id number] Candace Gallus MELVIN  Sonographer Comments: Image acquisition challenging due to respiratory motion. IMPRESSIONS  1. Left ventricular ejection fraction, by estimation, is 25%. The left ventricle has severely decreased function. The left ventricle demonstrates global hypokinesis. The left ventricular internal cavity size was mildly dilated. Left ventricular diastolic parameters are indeterminate.  2. Right ventricular systolic function is moderately reduced. The right ventricular size is mildly enlarged. There is mildly elevated pulmonary artery systolic pressure. The estimated right ventricular systolic pressure is 23.7 mmHg.  3. Left atrial size was mildly dilated.  4. Right atrial size was moderately dilated.  5. The mitral valve is normal in structure. Mild mitral valve regurgitation. No evidence of mitral stenosis.  6. Tricuspid valve regurgitation is moderate.  7. The aortic valve is tricuspid. There is mild calcification of the aortic valve. Aortic valve regurgitation is trivial. No aortic stenosis is present.  8. Aortic dilatation noted. There is mild dilatation of the aortic root, measuring 37 mm.  9. The inferior vena cava is dilated in size with <50% respiratory variability, suggesting right atrial pressure of 15 mmHg. FINDINGS  Left Ventricle: Left ventricular ejection fraction, by estimation, is 25%. The left ventricle has severely decreased function. The left ventricle demonstrates global hypokinesis. The left ventricular internal cavity size was mildly dilated. There is no left ventricular hypertrophy. Left ventricular diastolic  parameters are indeterminate. Right Ventricle: The right ventricular size is mildly enlarged. No increase in right ventricular wall thickness. Right ventricular systolic function is moderately reduced.  There is mildly elevated pulmonary artery systolic pressure. The tricuspid regurgitant velocity is 2.51 m/s, and with an assumed right atrial pressure of 15 mmHg, the estimated right ventricular systolic pressure is 80.9 mmHg. Left [facility]: Left atrial size was mildly dilated. Right [facility]: Right atrial size was moderately dilated. Pericardium: There is no evidence of pericardial effusion. Mitral Valve: The mitral valve is normal in structure. Mild mitral valve regurgitation. No evidence of mitral valve stenosis. Tricuspid Valve: The tricuspid valve is normal in structure. Tricuspid valve regurgitation is moderate. Aortic Valve: The aortic valve is tricuspid. There is mild calcification of the aortic valve. Aortic valve regurgitation is trivial. No aortic stenosis is present. Aortic valve mean gradient measures 3.0 mmHg. Aortic valve peak gradient measures 4.8 mmHg. Aortic valve area, by VTI measures 2.72 cm. Pulmonic Valve: The pulmonic valve was normal in structure. Pulmonic valve regurgitation is trivial. Aorta: Aortic dilatation noted. There is mild dilatation of the aortic root, measuring 37 mm. Venous: The inferior vena cava is dilated in size with less than 50% respiratory variability, suggesting right atrial pressure of 15 mmHg. IAS/Shunts: No atrial level shunt detected by color flow Doppler.  LEFT VENTRICLE PLAX 2D LVIDd:         5.50 cm   Diastology LVIDs:         4.60 cm   LV e' medial:    7.49 cm/s LV PW:         1.20 cm   LV E/e' medial:  10.0 LV IVS:        0.80 cm   LV e' lateral:   11.10 cm/s LVOT diam:     2.30 cm   LV E/e' lateral: 6.8 LV SV:         46 LV SV Index:   19 LVOT Area:     4.15 cm  RIGHT VENTRICLE            IVC RV S prime:     4.26 cm/s  IVC diam: 2.40 cm TAPSE (M-mode): 0.6 cm LEFT  ATRIUM             Index        RIGHT ATRIUM           Index LA diam:        4.90 cm 2.04 cm/m   RA Area:     30.20 cm LA Vol (A2C):   90.1 ml 37.53 ml/m  RA Volume:   107.00 ml 44.57 ml/m LA Vol (A4C):   92.0 ml 38.32 ml/m LA Biplane Vol: 92.2 ml 38.40 ml/m  AORTIC VALVE AV Area (Vmax):    2.73 cm AV Area (Vmean):   2.72 cm AV Area (VTI):     2.72 cm AV Vmax:           109.00 cm/s AV Vmean:          75.450 cm/s AV VTI:            0.168 m AV Peak Grad:      4.8 mmHg AV Mean Grad:      3.0 mmHg LVOT Vmax:         71.50 cm/s LVOT Vmean:        49.400 cm/s LVOT VTI:          0.110 m LVOT/AV VTI ratio: 0.66  AORTA Ao Root diam: 3.70 cm Ao Asc diam:  3.50 cm MITRAL VALVE  TRICUSPID VALVE MV Area (PHT): 5.13 cm    TR Peak grad:   25.2 mmHg MV Decel Time: 148 msec    TR Vmax:        251.00 cm/s MR Peak grad: 59.9 mmHg MR Vmax:      387.00 cm/s  SHUNTS MV E velocity: 75.20 cm/s  Systemic VTI:  0.11 m MV A velocity: 33.80 cm/s  Systemic Diam: 2.30 cm MV E/A ratio:  2.22 Dalton McleanMD Electronically signed by Franki Monte Signature Date/Time: 02/18/2022/1:31:36 PM    Final    CT Angio Chest PE W/Cm &/Or Wo Cm  Result Date: 02/17/2022 CLINICAL DATA:  Shortness of breath and swelling of the right leg. EXAM: CT ANGIOGRAPHY CHEST WITH CONTRAST TECHNIQUE: Multidetector CT imaging of the chest was performed using the standard protocol during bolus administration of intravenous contrast. Multiplanar CT image reconstructions and MIPs were obtained to evaluate the vascular anatomy. RADIATION DOSE REDUCTION: This exam was performed according to the departmental dose-optimization program which includes automated exposure control, adjustment of the mA and/or kV according to patient size and/or use of iterative reconstruction technique. CONTRAST:  76m OMNIPAQUE IOHEXOL 350 MG/ML SOLN COMPARISON:  CT examination dated May 07, 2013 FINDINGS: Cardiovascular: Satisfactory opacification of the pulmonary arteries  to the segmental level. No evidence of pulmonary embolism. Normal enlarged. No pericardial effusion. Reflux of contrast into the hepatic veins suggesting right heart failure. Mediastinum/Nodes: No enlarged mediastinal, hilar, or axillary lymph nodes. Thyroid gland, trachea, and esophagus demonstrate no significant findings. Lungs/Pleura: Biapical pleural/parenchymal scarring with large subpleural and centrilobular emphysematous bulla. Small bilateral pleural effusions, right greater than the left. Mild bibasilar atelectasis. No evidence of pneumonia. Upper Abdomen: No acute abnormality. Prominent left adrenal gland suggesting adrenal hyperplasia. Musculoskeletal: Degenerate disc disease with prominent anterior osteophytes. No acute osseous abnormality Review of the MIP images confirms the above findings. IMPRESSION: 1. No evidence of pulmonary embolism. 2. Small bilateral pleural effusions, right greater than left with associated bibasilar atelectasis. 3. Biapical pleural/parenchymal scarring with large subpleural and centrilobular emphysematous bulla. 4. Heart is enlarged. Reflux of contrast into the hepatic veins suggesting right heart failure. 5. No evidence of pneumonia or pulmonary edema. 6. 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 No acute osseous abnormality. 7. Prominent left adrenal gland suggesting adrenal hyperplasia. Emphysema (ICD10-J43.9). Electronically Signed   By: IKeane PoliceD.O.   On: 02/17/2022 13:19   DG Chest Port 1 View  Result Date: 02/17/2022 CLINICAL DATA:  Shortness of breath. EXAM: PORTABLE CHEST 1 VIEW COMPARISON:  CXR 02/11/21 FINDINGS: No pleural effusion. No pneumothorax. Compared to prior exam there is interval increase in size of the cardiac contours, which could suggest presence of a pericardial effusion. There are prominent bilateral interstitial opacities, suggestive of either atypical infection or pulmonary venous congestion. No radiographically apparent displaced rib  fractures. No focal airspace opacity. IMPRESSION: 1. Compared to prior exam there is interval increase in size of the cardiac contours, which could suggest presence of a pericardial effusion. Correlate with echocardiography. 2. Prominent bilateral interstitial opacities, suggestive of either atypical infection or pulmonary venous congestion. Electronically Signed   By: HMarin RobertsM.D.   On: 02/17/2022 11:34      LOS: 1 day    LFlora Lipps MD [facility] Available via Epic secure chat 7am-7pm After these hours, please refer to coverage provider listed on [email or web address] 02/19/2022, 8:23 AM

## 2022-02-19 NOTE — Progress Notes (Signed)
Progress Note  Patient Name: Terry Beltran MRN: [id number] DOB: Oct 12, 1955 Date of Encounter: 02/19/2022  Attending physician: Flora Lipps, MD Primary care provider: Karie Mainland, FNP  Subjective: Terry Beltran is a 67 y.o. African-American male who was seen and examined at bedside  Shortness of breath is improving. Diuresing well. Denies chest pain Case discussed and reviewed with his nurse.  Objective: Vital Signs in the last 24 hours: Temp:  [97.7 F (36.5 C)-98 F (36.7 C)] 98 F (36.7 C) (02/06 0722) Pulse Rate:  [65-79] 68 (02/06 0722) Resp:  [18] 18 (02/06 0722) BP: (102-123)/(58-91) 123/86 (02/06 0722) SpO2:  [90 %-97 %] 90 % (02/06 0722) Weight:  [121.7 kg] 121.7 kg (02/06 0605)  Intake/Output:  Intake/Output Summary (Last 24 hours) at 02/19/2022 1038 Last data filed at 02/19/2022 0825 Gross per 24 hour  Intake 480 ml  Output 2375 ml  Net -1895 ml    Net IO Since Admission: -4,085 mL [02/19/22 1038]  Weights:     02/19/2022    6:05 AM 02/18/2022    5:00 AM 02/17/2022    3:57 PM  Last 3 Weights  Weight (lbs) 268 lb 3.2 oz 278 lb 282 lb 3 oz  Weight (kg) 121.655 kg 126.1 kg 128 kg      Telemetry:  Overnight telemetry shows atrial flutter with controlled ventricular rate, which I personally reviewed.   Physical examination: PHYSICAL EXAM: Vitals:   02/18/22 2012 02/19/22 0034 02/19/22 0605 02/19/22 0722  BP: (!) 107/91 119/83 102/83 123/86  Pulse: 65 72 79 68  Resp: '18 18 18 18  '$ Temp: 97.7 F (36.5 C) 97.7 F (36.5 C) 97.7 F (36.5 C) 98 F (36.7 C)  TempSrc: Oral Oral Oral Oral  SpO2:  90% 90% 90%  Weight:   121.7 kg   Height:        Physical Exam  Constitutional: No distress.  Age appropriate, hemodynamically stable.   Neck: JVD present.  Cardiovascular: Normal rate, S1 normal, S2 normal, intact distal pulses and normal pulses. An irregular rhythm present. Exam reveals no gallop, no S3 and no S4.  No murmur heard. Pulmonary/Chest:  Effort normal and breath sounds normal. No stridor. He has no wheezes. He has no rales.  Abdominal: Soft. Bowel sounds are normal. He exhibits no distension. There is no abdominal tenderness.  Musculoskeletal:        General: Edema (+1 bilaterally) present.     Cervical back: Neck supple.  Neurological: He is alert and oriented to person, place, and time. He has intact cranial nerves (2-12).  Skin: Skin is warm and moist.   Lab Results: Chemistry Recent Labs  Lab 02/17/22 1113 02/18/22 0028 02/19/22 0040  NA 137 141 138  K 3.8 4.4 3.4*  CL 105 102 100  CO2 '25 26 26  '$ GLUCOSE 192* 103* 101*  BUN '16 15 15  '$ CREATININE 1.29* 1.39* 1.35*  CALCIUM 8.8* 9.6 9.2  PROT  --  7.2  --   ALBUMIN  --  3.4*  --   AST  --  23  --   ALT  --  16  --   ALKPHOS  --  64  --   BILITOT  --  0.7  --   GFRNONAA >60 55* 57*  ANIONGAP '7 13 12    '$ Hematology Recent Labs  Lab 02/17/22 1113 02/18/22 0028  WBC 8.7 9.7  RBC 3.83* 3.95*  HGB 12.2* 12.4*  HCT 37.7* 39.6  MCV 98.4 100.3*  MCH 31.9 31.4  MCHC 32.4 31.3  RDW 16.5* 16.5*  PLT 248 243   High Sensitivity Troponin:   Recent Labs  Lab 02/17/22 1113 02/17/22 1313  TROPONINIHS 40* 41*     Cardiac EnzymesNo results for input(s): "TROPONINI" in the last 168 hours. No results for input(s): "TROPIPOC" in the last 168 hours.  BNP Recent Labs  Lab 02/17/22 1113 02/19/22 0040  BNP 681.8* 791.5*    DDimer  Recent Labs  Lab 02/17/22 1113  DDIMER 1.66*    Hemoglobin A1c:  Lab Results  Component Value Date   HGBA1C 5.8 (H) 02/19/2022   MPG 119.76 02/19/2022   TSH  Recent Labs    02/17/22 1114 02/19/22 0040  TSH 1.591 2.234   Lipid Panel  Lab Results  Component Value Date   CHOL 119 02/19/2022   HDL 29 (L) 02/19/2022   LDLCALC 73 02/19/2022   TRIG 86 02/19/2022   CHOLHDL 4.1 02/19/2022   Drugs of Abuse  No results found for: "LABOPIA", "COCAINSCRNUR", "LABBENZ", "AMPHETMU", "THCU", "LABBARB"    Imaging: VAS [place]  LOWER EXTREMITY VENOUS (DVT)  Result Date: 02/18/2022  Lower Venous DVT Study Patient Name:  Terry Beltran  Date of Exam:   02/18/2022 Medical Rec #: [id number]         Accession #:    [id number] Date of Birth: 07/18/1955         Patient Gender: M Patient Age:   18 years Exam Location:  [facility] Procedure:      VAS [place] LOWER EXTREMITY VENOUS (DVT) Referring Phys: Sheppard Coil MELVIN --------------------------------------------------------------------------------  Indications: Edema.  Comparison Study: No previous exams Performing Technologist: Jody Hill RVT, RDMS  Examination Guidelines: A complete evaluation includes B-mode imaging, spectral Doppler, color Doppler, and power Doppler as needed of all accessible portions of each vessel. Bilateral testing is considered an integral part of a complete examination. Limited examinations for reoccurring indications may be performed as noted. The reflux portion of the exam is performed with the patient in reverse Trendelenburg.  +---------+---------------+---------+-----------+----------+-------------------+ RIGHT    CompressibilityPhasicitySpontaneityPropertiesThrombus Aging      +---------+---------------+---------+-----------+----------+-------------------+ CFV      Full           No       Yes                                      +---------+---------------+---------+-----------+----------+-------------------+ SFJ      Full                                                             +---------+---------------+---------+-----------+----------+-------------------+ FV Prox  Full           No       Yes                                      +---------+---------------+---------+-----------+----------+-------------------+ FV Mid   Full           No       Yes                                      +---------+---------------+---------+-----------+----------+-------------------+  FV DistalFull           No       Yes                                       +---------+---------------+---------+-----------+----------+-------------------+ PFV      Full                                                             +---------+---------------+---------+-----------+----------+-------------------+ POP      Full           No       Yes                                      +---------+---------------+---------+-----------+----------+-------------------+ PTV      Full                                                             +---------+---------------+---------+-----------+----------+-------------------+ PERO     Full                                         Not well visualized +---------+---------------+---------+-----------+----------+-------------------+ pulsatile flow  +----+---------------+---------+-----------+----------+--------------+ LEFTCompressibilityPhasicitySpontaneityPropertiesThrombus Aging +----+---------------+---------+-----------+----------+--------------+ CFV Full           No       Yes                                 +----+---------------+---------+-----------+----------+--------------+ pulsatile flow    Summary: RIGHT: - There is no evidence of deep vein thrombosis in the lower extremity.  - No cystic structure found in the popliteal fossa.  LEFT: - No evidence of common femoral vein obstruction.  *See table(s) above for measurements and observations. Electronically signed by Harold Barban MD on 02/18/2022 at 10:09:38 PM.    Final    ECHOCARDIOGRAM COMPLETE  Result Date: 02/18/2022    ECHOCARDIOGRAM REPORT   Patient Name:   Terry Beltran Date of Exam: 02/18/2022 Medical Rec #:  [id number]        Height:       70.0 in Accession #:    [id number]       Weight:       278.0 lb Date of Birth:  1955-06-14        BSA:          2.401 m Patient Age:    18 years         BP:           135/95 mmHg Patient Gender: M                HR:           62 bpm. Exam Location:  Inpatient Procedure: 2D Echo, Cardiac Doppler and  Color Doppler Indications:    CHF-Acute  Systolic V03.50  History:        Patient has no prior history of Echocardiogram examinations.                 CHF, Arrythmias:Atrial Flutter; Risk Factors:Hypertension and                 Dyslipidemia. Cancer.  Sonographer:    Ronny Flurry Referring Phys: [id number] Candace Gallus MELVIN  Sonographer Comments: Image acquisition challenging due to respiratory motion. IMPRESSIONS  1. Left ventricular ejection fraction, by estimation, is 25%. The left ventricle has severely decreased function. The left ventricle demonstrates global hypokinesis. The left ventricular internal cavity size was mildly dilated. Left ventricular diastolic parameters are indeterminate.  2. Right ventricular systolic function is moderately reduced. The right ventricular size is mildly enlarged. There is mildly elevated pulmonary artery systolic pressure. The estimated right ventricular systolic pressure is 99.3 mmHg.  3. Left atrial size was mildly dilated.  4. Right atrial size was moderately dilated.  5. The mitral valve is normal in structure. Mild mitral valve regurgitation. No evidence of mitral stenosis.  6. Tricuspid valve regurgitation is moderate.  7. The aortic valve is tricuspid. There is mild calcification of the aortic valve. Aortic valve regurgitation is trivial. No aortic stenosis is present.  8. Aortic dilatation noted. There is mild dilatation of the aortic root, measuring 37 mm.  9. The inferior vena cava is dilated in size with <50% respiratory variability, suggesting right atrial pressure of 15 mmHg. FINDINGS  Left Ventricle: Left ventricular ejection fraction, by estimation, is 25%. The left ventricle has severely decreased function. The left ventricle demonstrates global hypokinesis. The left ventricular internal cavity size was mildly dilated. There is no left ventricular hypertrophy. Left ventricular diastolic parameters are indeterminate. Right Ventricle: The right ventricular size  is mildly enlarged. No increase in right ventricular wall thickness. Right ventricular systolic function is moderately reduced. There is mildly elevated pulmonary artery systolic pressure. The tricuspid regurgitant velocity is 2.51 m/s, and with an assumed right atrial pressure of 15 mmHg, the estimated right ventricular systolic pressure is 71.6 mmHg. Left Atrium: Left atrial size was mildly dilated. Right Atrium: Right atrial size was moderately dilated. Pericardium: There is no evidence of pericardial effusion. Mitral Valve: The mitral valve is normal in structure. Mild mitral valve regurgitation. No evidence of mitral valve stenosis. Tricuspid Valve: The tricuspid valve is normal in structure. Tricuspid valve regurgitation is moderate. Aortic Valve: The aortic valve is tricuspid. There is mild calcification of the aortic valve. Aortic valve regurgitation is trivial. No aortic stenosis is present. Aortic valve mean gradient measures 3.0 mmHg. Aortic valve peak gradient measures 4.8 mmHg. Aortic valve area, by VTI measures 2.72 cm. Pulmonic Valve: The pulmonic valve was normal in structure. Pulmonic valve regurgitation is trivial. Aorta: Aortic dilatation noted. There is mild dilatation of the aortic root, measuring 37 mm. Venous: The inferior vena cava is dilated in size with less than 50% respiratory variability, suggesting right atrial pressure of 15 mmHg. IAS/Shunts: No atrial level shunt detected by color flow Doppler.  LEFT VENTRICLE PLAX 2D LVIDd:         5.50 cm   Diastology LVIDs:         4.60 cm   LV e' medial:    7.49 cm/s LV PW:         1.20 cm   LV E/e' medial:  10.0 LV IVS:        0.80 cm   LV e'  lateral:   11.10 cm/s LVOT diam:     2.30 cm   LV E/e' lateral: 6.8 LV SV:         46 LV SV Index:   19 LVOT Area:     4.15 cm  RIGHT VENTRICLE            IVC RV S prime:     4.26 cm/s  IVC diam: 2.40 cm TAPSE (M-mode): 0.6 cm LEFT ATRIUM             Index        RIGHT ATRIUM           Index LA diam:         4.90 cm 2.04 cm/m   RA Area:     30.20 cm LA Vol (A2C):   90.1 ml 37.53 ml/m  RA Volume:   107.00 ml 44.57 ml/m LA Vol (A4C):   92.0 ml 38.32 ml/m LA Biplane Vol: 92.2 ml 38.40 ml/m  AORTIC VALVE AV Area (Vmax):    2.73 cm AV Area (Vmean):   2.72 cm AV Area (VTI):     2.72 cm AV Vmax:           109.00 cm/s AV Vmean:          75.450 cm/s AV VTI:            0.168 m AV Peak Grad:      4.8 mmHg AV Mean Grad:      3.0 mmHg LVOT Vmax:         71.50 cm/s LVOT Vmean:        49.400 cm/s LVOT VTI:          0.110 m LVOT/AV VTI ratio: 0.66  AORTA Ao Root diam: 3.70 cm Ao Asc diam:  3.50 cm MITRAL VALVE               TRICUSPID VALVE MV Area (PHT): 5.13 cm    TR Peak grad:   25.2 mmHg MV Decel Time: 148 msec    TR Vmax:        251.00 cm/s MR Peak grad: 59.9 mmHg MR Vmax:      387.00 cm/s  SHUNTS MV E velocity: 75.20 cm/s  Systemic VTI:  0.11 m MV A velocity: 33.80 cm/s  Systemic Diam: 2.30 cm MV E/A ratio:  2.22 Dalton McleanMD Electronically signed by Franki Monte Signature Date/Time: 02/18/2022/1:31:36 PM    Final    CT Angio Chest PE W/Cm &/Or Wo Cm  Result Date: 02/17/2022 CLINICAL DATA:  Shortness of breath and swelling of the right leg. EXAM: CT ANGIOGRAPHY CHEST WITH CONTRAST TECHNIQUE: Multidetector CT imaging of the chest was performed using the standard protocol during bolus administration of intravenous contrast. Multiplanar CT image reconstructions and MIPs were obtained to evaluate the vascular anatomy. RADIATION DOSE REDUCTION: This exam was performed according to the departmental dose-optimization program which includes automated exposure control, adjustment of the mA and/or kV according to patient size and/or use of iterative reconstruction technique. CONTRAST:  50m OMNIPAQUE IOHEXOL 350 MG/ML SOLN COMPARISON:  CT examination dated May 07, 2013 FINDINGS: Cardiovascular: Satisfactory opacification of the pulmonary arteries to the segmental level. No evidence of pulmonary embolism. Normal enlarged.  No pericardial effusion. Reflux of contrast into the hepatic veins suggesting right heart failure. Mediastinum/Nodes: No enlarged mediastinal, hilar, or axillary lymph nodes. Thyroid gland, trachea, and esophagus demonstrate no significant findings. Lungs/Pleura: Biapical pleural/parenchymal scarring with large subpleural and centrilobular emphysematous bulla. Small  bilateral pleural effusions, right greater than the left. Mild bibasilar atelectasis. No evidence of pneumonia. Upper Abdomen: No acute abnormality. Prominent left adrenal gland suggesting adrenal hyperplasia. Musculoskeletal: Degenerate disc disease with prominent anterior osteophytes. No acute osseous abnormality Review of the MIP images confirms the above findings. IMPRESSION: 1. No evidence of pulmonary embolism. 2. Small bilateral pleural effusions, right greater than left with associated bibasilar atelectasis. 3. Biapical pleural/parenchymal scarring with large subpleural and centrilobular emphysematous bulla. 4. Heart is enlarged. Reflux of contrast into the hepatic veins suggesting right heart failure. 5. No evidence of pneumonia or pulmonary edema. 6. 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 No acute osseous abnormality. 7. Prominent left adrenal gland suggesting adrenal hyperplasia. Emphysema (ICD10-J43.9). Electronically Signed   By: Keane Police D.O.   On: 02/17/2022 13:19   DG Chest Port 1 View  Result Date: 02/17/2022 CLINICAL DATA:  Shortness of breath. EXAM: PORTABLE CHEST 1 VIEW COMPARISON:  CXR 02/11/21 FINDINGS: No pleural effusion. No pneumothorax. Compared to prior exam there is interval increase in size of the cardiac contours, which could suggest presence of a pericardial effusion. There are prominent bilateral interstitial opacities, suggestive of either atypical infection or pulmonary venous congestion. No radiographically apparent displaced rib fractures. No focal airspace opacity. IMPRESSION: 1. Compared to prior exam  there is interval increase in size of the cardiac contours, which could suggest presence of a pericardial effusion. Correlate with echocardiography. 2. Prominent bilateral interstitial opacities, suggestive of either atypical infection or pulmonary venous congestion. Electronically Signed   By: Marin Roberts M.D.   On: 02/17/2022 11:34    RADIOLOGY: CT angiogram chest PE protocol 02/17/2022: 1. No evidence of pulmonary embolism.  2. Small bilateral pleural effusions, right greater than left with associated bibasilar atelectasis.  3. Biapical pleural/parenchymal scarring with large subpleural and centrilobular emphysematous bulla.  4. Heart is enlarged. Reflux of contrast into the hepatic veins suggesting right heart failure.  5. No evidence of pneumonia or pulmonary edema.  6. 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 No acute osseous abnormality.  7. Prominent left adrenal gland suggesting adrenal hyperplasia. 8. 8. Biapical pleural/parenchymal scarring with large subpleural and centrilobular emphysematous bulla. Small bilateral pleural effusions, right greater than the left.   DG Chest Port 1 View 02/17/2022 CLINICAL DATA:  Shortness of breath. EXAM: PORTABLE CHEST 1 VIEW COMPARISON:  CXR 02/11/21 FINDINGS: No pleural effusion. No pneumothorax. Compared to prior exam there is interval increase in size of the cardiac contours, which could suggest presence of a pericardial effusion. There are prominent bilateral interstitial opacities, suggestive of either atypical infection or pulmonary venous congestion. No radiographically apparent displaced rib fractures. No focal airspace opacity.  CARDIAC DATABASE: EKG: 02/17/2022: Atypical atrial flutter with variable AV conduction at rate of 64 bpm, right bundle branch block. Low-voltage complexes. Single PVC. Compared to 03/11/2019, atypical atrial flutter is new.   Echocardiogram: 02/18/2022:  1. Left ventricular ejection fraction, by estimation, is 25%.  The left ventricle has severely decreased function. The left ventricle demonstrates global hypokinesis. The left ventricular internal cavity size was mildly dilated. Left ventricular  diastolic parameters are indeterminate.  2. Right ventricular systolic function is moderately reduced. The right ventricular size is mildly enlarged. There is mildly elevated pulmonary artery systolic pressure. The estimated right ventricular systolic pressure is 16.1 mmHg.  3. Left atrial size was mildly dilated.  4. Right atrial size was moderately dilated.  5. The mitral valve is normal in structure. Mild mitral valve regurgitation. No evidence  of mitral stenosis.  6. Tricuspid valve regurgitation is moderate.  7. The aortic valve is tricuspid. There is mild calcification of the aortic valve. Aortic valve regurgitation is trivial. No aortic stenosis is present.  8. Aortic dilatation noted. There is mild dilatation of the aortic root, measuring 37 mm.  9. The inferior vena cava is dilated in size with <50% respiratory variability, suggesting right atrial pressure of 15 mmHg.  Stress test: None  Heart catheterization: None  Scheduled Meds:  amLODipine  10 mg Oral Daily   apixaban  5 mg Oral BID   atorvastatin  20 mg Oral Daily   dapagliflozin propanediol  10 mg Oral Daily   gabapentin  300 mg Oral QHS   metoprolol succinate  25 mg Oral Daily   sacubitril-valsartan  1 tablet Oral BID   sodium chloride flush  3 mL Intravenous Q12H    Continuous Infusions:   PRN Meds: acetaminophen **OR** acetaminophen, ipratropium-albuterol, polyethylene glycol   IMPRESSION & RECOMMENDATIONS: Terry Beltran is a 67 y.o. African-American male whose past medical history and cardiac risk factors include: history of hepatitis C, primary hypertension, hypercholesterolemia, history of non-Hodgkin's lymphoma status post chemotherapy in 2016 and in remission, admitted to the hospital with worsening dyspnea, leg edema, new  onset heart failure and EKG revealing atypical atrial flutter with controlled ventricular response.   Impression: Acute newly discovered systolic/diastolic heart failure. Cardiomyopathy, etiology unspecific. New onset of atrial flutter with rapid ventricular rate-rate is now better controlled. Long-term oral anticoagulation Long-term antiarrhythmic medications. Hypertension Elevated D-dimers-CT PE protocol negative for PE Tobacco use disorder. History of non-Hodgkin's lymphoma status postchemotherapy/now on remission  Recommendations: New onset of systolic and diastolic heart failure likely precipitated by atrial flutter with rapid ventricular rate.  Ventricular rate has improved and he is diuresing well on current medical therapy.  Continue Terry Beltran, [place].  Discontinue amlodipine.  Start spironolactone 25 mg p.o. daily.  Despite diuresis his BNP continues to trend up.  Will restart Bumex 1 mg IV push twice daily.  Strict I's and O's, daily weights.  Order compression stockings bilaterally to help mobilize fluids.  Net IO Since Admission: -4,685 mL [02/19/22 1125] Wt Readings from Last 3 Encounters:  02/19/22 121.7 kg  02/11/21 114.8 kg  03/11/19 114.3 kg   With regards to the management of new onset of atrial flutter.  Ventricular rate improved on current dose of metoprolol.  Will start IV amiodarone for rhythm management.  If he stays in atrial flutter despite being on amiodarone patient is agreeable with proceeding with TEE guided cardioversion.  He will discuss this with his wife in the interim.  Discussed the risks, benefits, alternatives to TEE guided cardioversion.  He verbalizes understanding.  N.p.o. after midnight continue Eliquis.  Tentatively scheduled at 130pm on 02/20/2022  Would benefit from outpatient evaluation of sleep apnea.  Reemphasized importance of complete smoking cessation.  He denies anginal discomfort.  Ideally would recommend ischemic workup as  outpatient after he is compensated.  Further recommendations to follow based on his clinical trajectory.  Ideally would not recommend amiodarone long-term given the side effect profile as discussed with the patient.  Once ischemic workup is complete and antiarrhythmic medication is still needed to maintain rhythm other antiarrhythmics should be considered.  Patient understands that if he remains on amiodarone long-term he will need to have serial follow-up evaluation of thyroid function, lung function, liver function, ophthalmological evaluation.  Baseline TSH, AST ALT, and chest x-ray reviewed.  Does not endorse evidence  of bleeding.  Risks, benefits, and alternatives to anticoagulation discussed.  Patient's questions and concerns were addressed to his satisfaction. He voices understanding of the instructions provided during this encounter.   This note was created using a voice recognition software as a result there may be grammatical errors inadvertently enclosed that do not reflect the nature of this encounter. Every attempt is made to correct such errors.  Total time spent 50 minutes.  [facility]  Pager: [phone] Office: [phone] 02/19/2022, 10:38 AM

## 2022-02-19 NOTE — H&P (View-Only) (Signed)
Progress Note  Patient Name: Terry Beltran MRN: [id number] DOB: 09-22-1955 Date of Encounter: 02/19/2022  Attending physician: Flora Lipps, MD Primary care provider: Karie Mainland, FNP  Subjective: Terry Beltran is a 67 y.o. African-American male who was seen and examined at bedside  Shortness of breath is improving. Diuresing well. Denies chest pain Case discussed and reviewed with his nurse.  Objective: Vital Signs in the last 24 hours: Temp:  [97.7 F (36.5 C)-98 F (36.7 C)] 98 F (36.7 C) (02/06 0722) Pulse Rate:  [65-79] 68 (02/06 0722) Resp:  [18] 18 (02/06 0722) BP: (102-123)/(58-91) 123/86 (02/06 0722) SpO2:  [90 %-97 %] 90 % (02/06 0722) Weight:  [121.7 kg] 121.7 kg (02/06 0605)  Intake/Output:  Intake/Output Summary (Last 24 hours) at 02/19/2022 1038 Last data filed at 02/19/2022 0825 Gross per 24 hour  Intake 480 ml  Output 2375 ml  Net -1895 ml    Net IO Since Admission: -4,085 mL [02/19/22 1038]  Weights:     02/19/2022    6:05 AM 02/18/2022    5:00 AM 02/17/2022    3:57 PM  Last 3 Weights  Weight (lbs) 268 lb 3.2 oz 278 lb 282 lb 3 oz  Weight (kg) 121.655 kg 126.1 kg 128 kg      Telemetry:  Overnight telemetry shows atrial flutter with controlled ventricular rate, which I personally reviewed.   Physical examination: PHYSICAL EXAM: Vitals:   02/18/22 2012 02/19/22 0034 02/19/22 0605 02/19/22 0722  BP: (!) 107/91 119/83 102/83 123/86  Pulse: 65 72 79 68  Resp: '18 18 18 18  '$ Temp: 97.7 F (36.5 C) 97.7 F (36.5 C) 97.7 F (36.5 C) 98 F (36.7 C)  TempSrc: Oral Oral Oral Oral  SpO2:  90% 90% 90%  Weight:   121.7 kg   Height:        Physical Exam  Constitutional: No distress.  Age appropriate, hemodynamically stable.   Neck: JVD present.  Cardiovascular: Normal rate, S1 normal, S2 normal, intact distal pulses and normal pulses. An irregular rhythm present. Exam reveals no gallop, no S3 and no S4.  No murmur heard. Pulmonary/Chest:  Effort normal and breath sounds normal. No stridor. He has no wheezes. He has no rales.  Abdominal: Soft. Bowel sounds are normal. He exhibits no distension. There is no abdominal tenderness.  Musculoskeletal:        General: Edema (+1 bilaterally) present.     Cervical back: Neck supple.  Neurological: He is alert and oriented to person, place, and time. He has intact cranial nerves (2-12).  Skin: Skin is warm and moist.   Lab Results: Chemistry Recent Labs  Lab 02/17/22 1113 02/18/22 0028 02/19/22 0040  NA 137 141 138  K 3.8 4.4 3.4*  CL 105 102 100  CO2 '25 26 26  '$ GLUCOSE 192* 103* 101*  BUN '16 15 15  '$ CREATININE 1.29* 1.39* 1.35*  CALCIUM 8.8* 9.6 9.2  PROT  --  7.2  --   ALBUMIN  --  3.4*  --   AST  --  23  --   ALT  --  16  --   ALKPHOS  --  64  --   BILITOT  --  0.7  --   GFRNONAA >60 55* 57*  ANIONGAP '7 13 12    '$ Hematology Recent Labs  Lab 02/17/22 1113 02/18/22 0028  WBC 8.7 9.7  RBC 3.83* 3.95*  HGB 12.2* 12.4*  HCT 37.7* 39.6  MCV 98.4 100.3*  MCH 31.9 31.4  MCHC 32.4 31.3  RDW 16.5* 16.5*  PLT 248 243   High Sensitivity Troponin:   Recent Labs  Lab 02/17/22 1113 02/17/22 1313  TROPONINIHS 40* 41*     Cardiac EnzymesNo results for input(s): "TROPONINI" in the last 168 hours. No results for input(s): "TROPIPOC" in the last 168 hours.  BNP Recent Labs  Lab 02/17/22 1113 02/19/22 0040  BNP 681.8* 791.5*    DDimer  Recent Labs  Lab 02/17/22 1113  DDIMER 1.66*    Hemoglobin A1c:  Lab Results  Component Value Date   HGBA1C 5.8 (H) 02/19/2022   MPG 119.76 02/19/2022   TSH  Recent Labs    02/17/22 1114 02/19/22 0040  TSH 1.591 2.234   Lipid Panel  Lab Results  Component Value Date   CHOL 119 02/19/2022   HDL 29 (L) 02/19/2022   LDLCALC 73 02/19/2022   TRIG 86 02/19/2022   CHOLHDL 4.1 02/19/2022   Drugs of Abuse  No results found for: "LABOPIA", "COCAINSCRNUR", "LABBENZ", "AMPHETMU", "THCU", "LABBARB"    Imaging: VAS [place]  LOWER EXTREMITY VENOUS (DVT)  Result Date: 02/18/2022  Lower Venous DVT Study Patient Name:  Terry Beltran  Date of Exam:   02/18/2022 Medical Rec #: [id number]         Accession #:    [id number] Date of Birth: [birth year]         Patient Gender: M Patient Age:   30 years Exam Location:  [facility] Procedure:      VAS [place] LOWER EXTREMITY VENOUS (DVT) Referring Phys: Sheppard Coil MELVIN --------------------------------------------------------------------------------  Indications: Edema.  Comparison Study: No previous exams Performing Technologist: Jody Hill RVT, RDMS  Examination Guidelines: A complete evaluation includes B-mode imaging, spectral Doppler, color Doppler, and power Doppler as needed of all accessible portions of each vessel. Bilateral testing is considered an integral part of a complete examination. Limited examinations for reoccurring indications may be performed as noted. The reflux portion of the exam is performed with the patient in reverse Trendelenburg.  +---------+---------------+---------+-----------+----------+-------------------+ RIGHT    CompressibilityPhasicitySpontaneityPropertiesThrombus Aging      +---------+---------------+---------+-----------+----------+-------------------+ CFV      Full           No       Yes                                      +---------+---------------+---------+-----------+----------+-------------------+ SFJ      Full                                                             +---------+---------------+---------+-----------+----------+-------------------+ FV Prox  Full           No       Yes                                      +---------+---------------+---------+-----------+----------+-------------------+ FV Mid   Full           No       Yes                                      +---------+---------------+---------+-----------+----------+-------------------+  FV DistalFull           No       Yes                                       +---------+---------------+---------+-----------+----------+-------------------+ PFV      Full                                                             +---------+---------------+---------+-----------+----------+-------------------+ POP      Full           No       Yes                                      +---------+---------------+---------+-----------+----------+-------------------+ PTV      Full                                                             +---------+---------------+---------+-----------+----------+-------------------+ PERO     Full                                         Not well visualized +---------+---------------+---------+-----------+----------+-------------------+ pulsatile flow  +----+---------------+---------+-----------+----------+--------------+ LEFTCompressibilityPhasicitySpontaneityPropertiesThrombus Aging +----+---------------+---------+-----------+----------+--------------+ CFV Full           No       Yes                                 +----+---------------+---------+-----------+----------+--------------+ pulsatile flow    Summary: RIGHT: - There is no evidence of deep vein thrombosis in the lower extremity.  - No cystic structure found in the popliteal fossa.  LEFT: - No evidence of common femoral vein obstruction.  *See table(s) above for measurements and observations. Electronically signed by Harold Barban MD on 02/18/2022 at 10:09:38 PM.    Final    ECHOCARDIOGRAM COMPLETE  Result Date: 02/18/2022    ECHOCARDIOGRAM REPORT   Patient Name:   Terry Beltran Date of Exam: 02/18/2022 Medical Rec #:  [id number]        Height:       70.0 in Accession #:    [id number]       Weight:       278.0 lb Date of Birth:  Nov 09, 1955        BSA:          2.401 m Patient Age:    61 years         BP:           135/95 mmHg Patient Gender: M                HR:           62 bpm. Exam Location:  Inpatient Procedure: 2D Echo, Cardiac Doppler and  Color Doppler Indications:    CHF-Acute  Systolic G28.36  History:        Patient has no prior history of Echocardiogram examinations.                 CHF, Arrythmias:Atrial Flutter; Risk Factors:Hypertension and                 Dyslipidemia. Cancer.  Sonographer:    Ronny Flurry Referring Phys: [id number] Candace Gallus MELVIN  Sonographer Comments: Image acquisition challenging due to respiratory motion. IMPRESSIONS  1. Left ventricular ejection fraction, by estimation, is 25%. The left ventricle has severely decreased function. The left ventricle demonstrates global hypokinesis. The left ventricular internal cavity size was mildly dilated. Left ventricular diastolic parameters are indeterminate.  2. Right ventricular systolic function is moderately reduced. The right ventricular size is mildly enlarged. There is mildly elevated pulmonary artery systolic pressure. The estimated right ventricular systolic pressure is 46.5 mmHg.  3. Left atrial size was mildly dilated.  4. Right atrial size was moderately dilated.  5. The mitral valve is normal in structure. Mild mitral valve regurgitation. No evidence of mitral stenosis.  6. Tricuspid valve regurgitation is moderate.  7. The aortic valve is tricuspid. There is mild calcification of the aortic valve. Aortic valve regurgitation is trivial. No aortic stenosis is present.  8. Aortic dilatation noted. There is mild dilatation of the aortic root, measuring 37 mm.  9. The inferior vena cava is dilated in size with <50% respiratory variability, suggesting right atrial pressure of 15 mmHg. FINDINGS  Left Ventricle: Left ventricular ejection fraction, by estimation, is 25%. The left ventricle has severely decreased function. The left ventricle demonstrates global hypokinesis. The left ventricular internal cavity size was mildly dilated. There is no left ventricular hypertrophy. Left ventricular diastolic parameters are indeterminate. Right Ventricle: The right ventricular size  is mildly enlarged. No increase in right ventricular wall thickness. Right ventricular systolic function is moderately reduced. There is mildly elevated pulmonary artery systolic pressure. The tricuspid regurgitant velocity is 2.51 m/s, and with an assumed right atrial pressure of 15 mmHg, the estimated right ventricular systolic pressure is 03.5 mmHg. Left Atrium: Left atrial size was mildly dilated. Right Atrium: Right atrial size was moderately dilated. Pericardium: There is no evidence of pericardial effusion. Mitral Valve: The mitral valve is normal in structure. Mild mitral valve regurgitation. No evidence of mitral valve stenosis. Tricuspid Valve: The tricuspid valve is normal in structure. Tricuspid valve regurgitation is moderate. Aortic Valve: The aortic valve is tricuspid. There is mild calcification of the aortic valve. Aortic valve regurgitation is trivial. No aortic stenosis is present. Aortic valve mean gradient measures 3.0 mmHg. Aortic valve peak gradient measures 4.8 mmHg. Aortic valve area, by VTI measures 2.72 cm. Pulmonic Valve: The pulmonic valve was normal in structure. Pulmonic valve regurgitation is trivial. Aorta: Aortic dilatation noted. There is mild dilatation of the aortic root, measuring 37 mm. Venous: The inferior vena cava is dilated in size with less than 50% respiratory variability, suggesting right atrial pressure of 15 mmHg. IAS/Shunts: No atrial level shunt detected by color flow Doppler.  LEFT VENTRICLE PLAX 2D LVIDd:         5.50 cm   Diastology LVIDs:         4.60 cm   LV e' medial:    7.49 cm/s LV PW:         1.20 cm   LV E/e' medial:  10.0 LV IVS:        0.80 cm   LV e'  lateral:   11.10 cm/s LVOT diam:     2.30 cm   LV E/e' lateral: 6.8 LV SV:         46 LV SV Index:   19 LVOT Area:     4.15 cm  RIGHT VENTRICLE            IVC RV S prime:     4.26 cm/s  IVC diam: 2.40 cm TAPSE (M-mode): 0.6 cm LEFT ATRIUM             Index        RIGHT ATRIUM           Index LA diam:         4.90 cm 2.04 cm/m   RA Area:     30.20 cm LA Vol (A2C):   90.1 ml 37.53 ml/m  RA Volume:   107.00 ml 44.57 ml/m LA Vol (A4C):   92.0 ml 38.32 ml/m LA Biplane Vol: 92.2 ml 38.40 ml/m  AORTIC VALVE AV Area (Vmax):    2.73 cm AV Area (Vmean):   2.72 cm AV Area (VTI):     2.72 cm AV Vmax:           109.00 cm/s AV Vmean:          75.450 cm/s AV VTI:            0.168 m AV Peak Grad:      4.8 mmHg AV Mean Grad:      3.0 mmHg LVOT Vmax:         71.50 cm/s LVOT Vmean:        49.400 cm/s LVOT VTI:          0.110 m LVOT/AV VTI ratio: 0.66  AORTA Ao Root diam: 3.70 cm Ao Asc diam:  3.50 cm MITRAL VALVE               TRICUSPID VALVE MV Area (PHT): 5.13 cm    TR Peak grad:   25.2 mmHg MV Decel Time: 148 msec    TR Vmax:        251.00 cm/s MR Peak grad: 59.9 mmHg MR Vmax:      387.00 cm/s  SHUNTS MV E velocity: 75.20 cm/s  Systemic VTI:  0.11 m MV A velocity: 33.80 cm/s  Systemic Diam: 2.30 cm MV E/A ratio:  2.22 Dalton McleanMD Electronically signed by Franki Monte Signature Date/Time: 02/18/2022/1:31:36 PM    Final    CT Angio Chest PE W/Cm &/Or Wo Cm  Result Date: 02/17/2022 CLINICAL DATA:  Shortness of breath and swelling of the right leg. EXAM: CT ANGIOGRAPHY CHEST WITH CONTRAST TECHNIQUE: Multidetector CT imaging of the chest was performed using the standard protocol during bolus administration of intravenous contrast. Multiplanar CT image reconstructions and MIPs were obtained to evaluate the vascular anatomy. RADIATION DOSE REDUCTION: This exam was performed according to the departmental dose-optimization program which includes automated exposure control, adjustment of the mA and/or kV according to patient size and/or use of iterative reconstruction technique. CONTRAST:  56m OMNIPAQUE IOHEXOL 350 MG/ML SOLN COMPARISON:  CT examination dated May 07, 2013 FINDINGS: Cardiovascular: Satisfactory opacification of the pulmonary arteries to the segmental level. No evidence of pulmonary embolism. Normal enlarged.  No pericardial effusion. Reflux of contrast into the hepatic veins suggesting right heart failure. Mediastinum/Nodes: No enlarged mediastinal, hilar, or axillary lymph nodes. Thyroid gland, trachea, and esophagus demonstrate no significant findings. Lungs/Pleura: Biapical pleural/parenchymal scarring with large subpleural and centrilobular emphysematous bulla. Small  bilateral pleural effusions, right greater than the left. Mild bibasilar atelectasis. No evidence of pneumonia. Upper Abdomen: No acute abnormality. Prominent left adrenal gland suggesting adrenal hyperplasia. Musculoskeletal: Degenerate disc disease with prominent anterior osteophytes. No acute osseous abnormality Review of the MIP images confirms the above findings. IMPRESSION: 1. No evidence of pulmonary embolism. 2. Small bilateral pleural effusions, right greater than left with associated bibasilar atelectasis. 3. Biapical pleural/parenchymal scarring with large subpleural and centrilobular emphysematous bulla. 4. Heart is enlarged. Reflux of contrast into the hepatic veins suggesting right heart failure. 5. No evidence of pneumonia or pulmonary edema. 6. 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 No acute osseous abnormality. 7. Prominent left adrenal gland suggesting adrenal hyperplasia. Emphysema (ICD10-J43.9). Electronically Signed   By: Keane Police D.O.   On: 02/17/2022 13:19   DG Chest Port 1 View  Result Date: 02/17/2022 CLINICAL DATA:  Shortness of breath. EXAM: PORTABLE CHEST 1 VIEW COMPARISON:  CXR 02/11/21 FINDINGS: No pleural effusion. No pneumothorax. Compared to prior exam there is interval increase in size of the cardiac contours, which could suggest presence of a pericardial effusion. There are prominent bilateral interstitial opacities, suggestive of either atypical infection or pulmonary venous congestion. No radiographically apparent displaced rib fractures. No focal airspace opacity. IMPRESSION: 1. Compared to prior exam  there is interval increase in size of the cardiac contours, which could suggest presence of a pericardial effusion. Correlate with echocardiography. 2. Prominent bilateral interstitial opacities, suggestive of either atypical infection or pulmonary venous congestion. Electronically Signed   By: Marin Roberts M.D.   On: 02/17/2022 11:34    RADIOLOGY: CT angiogram chest PE protocol 02/17/2022: 1. No evidence of pulmonary embolism.  2. Small bilateral pleural effusions, right greater than left with associated bibasilar atelectasis.  3. Biapical pleural/parenchymal scarring with large subpleural and centrilobular emphysematous bulla.  4. Heart is enlarged. Reflux of contrast into the hepatic veins suggesting right heart failure.  5. No evidence of pneumonia or pulmonary edema.  6. 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 No acute osseous abnormality.  7. Prominent left adrenal gland suggesting adrenal hyperplasia. 8. 8. Biapical pleural/parenchymal scarring with large subpleural and centrilobular emphysematous bulla. Small bilateral pleural effusions, right greater than the left.   DG Chest Port 1 View 02/17/2022 CLINICAL DATA:  Shortness of breath. EXAM: PORTABLE CHEST 1 VIEW COMPARISON:  CXR 02/11/21 FINDINGS: No pleural effusion. No pneumothorax. Compared to prior exam there is interval increase in size of the cardiac contours, which could suggest presence of a pericardial effusion. There are prominent bilateral interstitial opacities, suggestive of either atypical infection or pulmonary venous congestion. No radiographically apparent displaced rib fractures. No focal airspace opacity.  CARDIAC DATABASE: EKG: 02/17/2022: Atypical atrial flutter with variable AV conduction at rate of 64 bpm, right bundle branch block. Low-voltage complexes. Single PVC. Compared to 03/11/2019, atypical atrial flutter is new.   Echocardiogram: 02/18/2022:  1. Left ventricular ejection fraction, by estimation, is 25%.  The left ventricle has severely decreased function. The left ventricle demonstrates global hypokinesis. The left ventricular internal cavity size was mildly dilated. Left ventricular  diastolic parameters are indeterminate.  2. Right ventricular systolic function is moderately reduced. The right ventricular size is mildly enlarged. There is mildly elevated pulmonary artery systolic pressure. The estimated right ventricular systolic pressure is 40.8 mmHg.  3. Left atrial size was mildly dilated.  4. Right atrial size was moderately dilated.  5. The mitral valve is normal in structure. Mild mitral valve regurgitation. No evidence  of mitral stenosis.  6. Tricuspid valve regurgitation is moderate.  7. The aortic valve is tricuspid. There is mild calcification of the aortic valve. Aortic valve regurgitation is trivial. No aortic stenosis is present.  8. Aortic dilatation noted. There is mild dilatation of the aortic root, measuring 37 mm.  9. The inferior vena cava is dilated in size with <50% respiratory variability, suggesting right atrial pressure of 15 mmHg.  Stress test: None  Heart catheterization: None  Scheduled Meds:  amLODipine  10 mg Oral Daily   apixaban  5 mg Oral BID   atorvastatin  20 mg Oral Daily   dapagliflozin propanediol  10 mg Oral Daily   gabapentin  300 mg Oral QHS   metoprolol succinate  25 mg Oral Daily   sacubitril-valsartan  1 tablet Oral BID   sodium chloride flush  3 mL Intravenous Q12H    Continuous Infusions:   PRN Meds: acetaminophen **OR** acetaminophen, ipratropium-albuterol, polyethylene glycol   IMPRESSION & RECOMMENDATIONS: Terry Beltran is a 67 y.o. African-American male whose past medical history and cardiac risk factors include: history of hepatitis C, primary hypertension, hypercholesterolemia, history of non-Hodgkin's lymphoma status post chemotherapy in 2016 and in remission, admitted to the hospital with worsening dyspnea, leg edema, new  onset heart failure and EKG revealing atypical atrial flutter with controlled ventricular response.   Impression: Acute newly discovered systolic/diastolic heart failure. Cardiomyopathy, etiology unspecific. New onset of atrial flutter with rapid ventricular rate-rate is now better controlled. Long-term oral anticoagulation Long-term antiarrhythmic medications. Hypertension Elevated D-dimers-CT PE protocol negative for PE Tobacco use disorder. History of non-Hodgkin's lymphoma status postchemotherapy/now on remission  Recommendations: New onset of systolic and diastolic heart failure likely precipitated by atrial flutter with rapid ventricular rate.  Ventricular rate has improved and he is diuresing well on current medical therapy.  Continue Berniece Andreas, [place].  Discontinue amlodipine.  Start spironolactone 25 mg p.o. daily.  Despite diuresis his BNP continues to trend up.  Will restart Bumex 1 mg IV push twice daily.  Strict I's and O's, daily weights.  Order compression stockings bilaterally to help mobilize fluids.  Net IO Since Admission: -4,685 mL [02/19/22 1125] Wt Readings from Last 3 Encounters:  02/19/22 121.7 kg  02/11/21 114.8 kg  03/11/19 114.3 kg   With regards to the management of new onset of atrial flutter.  Ventricular rate improved on current dose of metoprolol.  Will start IV amiodarone for rhythm management.  If he stays in atrial flutter despite being on amiodarone patient is agreeable with proceeding with TEE guided cardioversion.  He will discuss this with his wife in the interim.  Discussed the risks, benefits, alternatives to TEE guided cardioversion.  He verbalizes understanding.  N.p.o. after midnight continue Eliquis.  Tentatively scheduled at 130pm on 02/20/2022  Would benefit from outpatient evaluation of sleep apnea.  Reemphasized importance of complete smoking cessation.  He denies anginal discomfort.  Ideally would recommend ischemic workup as  outpatient after he is compensated.  Further recommendations to follow based on his clinical trajectory.  Ideally would not recommend amiodarone long-term given the side effect profile as discussed with the patient.  Once ischemic workup is complete and antiarrhythmic medication is still needed to maintain rhythm other antiarrhythmics should be considered.  Patient understands that if he remains on amiodarone long-term he will need to have serial follow-up evaluation of thyroid function, lung function, liver function, ophthalmological evaluation.  Baseline TSH, AST ALT, and chest x-ray reviewed.  Does not endorse evidence  of bleeding.  Risks, benefits, and alternatives to anticoagulation discussed.  Patient's questions and concerns were addressed to his satisfaction. He voices understanding of the instructions provided during this encounter.   This note was created using a voice recognition software as a result there may be grammatical errors inadvertently enclosed that do not reflect the nature of this encounter. Every attempt is made to correct such errors.  Total time spent 50 minutes.  Mechele Claude [facility]  Pager: [phone] Office: [phone] 02/19/2022, 10:38 AM

## 2022-02-19 NOTE — Discharge Instructions (Signed)
    ____________________________________________________________________________________________________________________________ Information on my medicine - ELIQUIS (apixaban)  This medication education was reviewed with me or my healthcare representative as part of my discharge preparation.  The pharmacist that spoke with me during my hospital stay was:  Kaleen Mask, [facility]  Why was Eliquis prescribed for you? Eliquis was prescribed for you to reduce the risk of a blood clot forming that can cause a stroke if you have a medical condition called atrial fibrillation (a type of irregular heartbeat).  What do You need to know about Eliquis ? Take your Eliquis TWICE DAILY - one tablet in the morning and one tablet in the evening with or without food. If you have difficulty swallowing the tablet whole please discuss with your pharmacist how to take the medication safely.  Take Eliquis exactly as prescribed by your doctor and DO NOT stop taking Eliquis without talking to the doctor who prescribed the medication.  Stopping may increase your risk of developing a stroke.  Refill your prescription before you run out.  After discharge, you should have regular check-up appointments with your healthcare provider that is prescribing your Eliquis.  In the future your dose may need to be changed if your kidney function or weight changes by a significant amount or as you get older.  What do you do if you miss a dose? If you miss a dose, take it as soon as you remember on the same day and resume taking twice daily.  Do not take more than one dose of ELIQUIS at the same time to make up a missed dose.  Important Safety Information A possible side effect of Eliquis is bleeding. You should call your healthcare provider right away if you experience any of the following: Bleeding from an injury or your nose that does not stop. Unusual colored urine (red or dark brown) or unusual colored stools (red or  black). Unusual bruising for unknown reasons. A serious fall or if you hit your head (even if there is no bleeding).  Some medicines may interact with Eliquis and might increase your risk of bleeding or clotting while on Eliquis. To help avoid this, consult your healthcare provider or pharmacist prior to using any new prescription or non-prescription medications, including herbals, vitamins, non-steroidal anti-inflammatory drugs (NSAIDs) and supplements.  This website has more information on Eliquis (apixaban): http://[email or web address].com/eliquis/home

## 2022-02-19 NOTE — Progress Notes (Signed)
Mobility Specialist - Progress Note   02/19/22 0900  Mobility  Activity Ambulated with assistance in hallway  Level of Assistance Contact guard assist, steadying assist  Assistive Device None  Distance Ambulated (ft) 160 ft  Activity Response Tolerated well  Mobility Referral Yes  $Mobility charge 1 Mobility    Pt received in bed agreeable to mobility. Took standing break x1 d/t c/o SOB. Tolerated increased distance well. Left sitting EOB w/ call bell in reach and all needs met.   [place] Specialist Please contact via SecureChat or Rehab office at [phone]

## 2022-02-20 ENCOUNTER — Inpatient Hospital Stay (HOSPITAL_COMMUNITY): Payer: 59 | Admitting: Certified Registered"

## 2022-02-20 ENCOUNTER — Inpatient Hospital Stay (HOSPITAL_COMMUNITY): Payer: 59

## 2022-02-20 ENCOUNTER — Encounter (HOSPITAL_COMMUNITY)
Admission: EM | Disposition: A | Discharge status: Home / Self Care | Payer: Self-pay | Source: Home / Self Care | Attending: Internal Medicine

## 2022-02-20 DIAGNOSIS — I4892 Unspecified atrial flutter: Secondary | ICD-10-CM

## 2022-02-20 DIAGNOSIS — I110 Hypertensive heart disease with heart failure: Secondary | ICD-10-CM

## 2022-02-20 DIAGNOSIS — I5023 Acute on chronic systolic (congestive) heart failure: Secondary | ICD-10-CM

## 2022-02-20 DIAGNOSIS — I509 Heart failure, unspecified: Secondary | ICD-10-CM

## 2022-02-20 DIAGNOSIS — N179 Acute kidney failure, unspecified: Secondary | ICD-10-CM

## 2022-02-20 DIAGNOSIS — N189 Chronic kidney disease, unspecified: Secondary | ICD-10-CM

## 2022-02-20 DIAGNOSIS — F17210 Nicotine dependence, cigarettes, uncomplicated: Secondary | ICD-10-CM

## 2022-02-20 HISTORY — PX: TEE WITHOUT CARDIOVERSION: SHX5443

## 2022-02-20 HISTORY — PX: CARDIOVERSION: SHX1299

## 2022-02-20 LAB — CBC
HCT: 41.5 % (ref 39.0–52.0)
Hemoglobin: 13.8 g/dL (ref 13.0–17.0)
MCH: 32.5 pg (ref 26.0–34.0)
MCHC: 33.3 g/dL (ref 30.0–36.0)
MCV: 97.9 fL (ref 80.0–100.0)
Platelets: 261 10*3/uL (ref 150–400)
RBC: 4.24 MIL/uL (ref 4.22–5.81)
RDW: 16.3 % — ABNORMAL HIGH (ref 11.5–15.5)
WBC: 9.3 10*3/uL (ref 4.0–10.5)
nRBC: 0 % (ref 0.0–0.2)

## 2022-02-20 LAB — BASIC METABOLIC PANEL
Anion gap: 9 (ref 5–15)
BUN: 19 mg/dL (ref 8–23)
CO2: 27 mmol/L (ref 22–32)
Calcium: 9.1 mg/dL (ref 8.9–10.3)
Chloride: 101 mmol/L (ref 98–111)
Creatinine, Ser: 1.61 mg/dL — ABNORMAL HIGH (ref 0.61–1.24)
GFR, Estimated: 46 mL/min — ABNORMAL LOW (ref >60)
Glucose, Bld: 110 mg/dL — ABNORMAL HIGH (ref 70–99)
Potassium: 4.1 mmol/L (ref 3.5–5.1)
Sodium: 137 mmol/L (ref 135–145)

## 2022-02-20 LAB — PROTIME-INR
INR: 1.5 — ABNORMAL HIGH (ref 0.8–1.2)
Prothrombin Time: 18.3 seconds — ABNORMAL HIGH (ref 11.4–15.2)

## 2022-02-20 LAB — BRAIN NATRIURETIC PEPTIDE: B Natriuretic Peptide: 441.6 pg/mL — ABNORMAL HIGH (ref 0.0–100.0)

## 2022-02-20 LAB — MAGNESIUM: Magnesium: 1.8 mg/dL (ref 1.7–2.4)

## 2022-02-20 SURGERY — ECHOCARDIOGRAM, TRANSESOPHAGEAL
Anesthesia: Monitor Anesthesia Care

## 2022-02-20 MED ORDER — SODIUM CHLORIDE 0.9 % IV SOLN: INTRAVENOUS | Status: DC

## 2022-02-20 MED ORDER — PROPOFOL 500 MG/50ML IV EMUL
INTRAVENOUS | Status: DC | PRN
  Administered 2022-02-20: 100 ug/kg/min via INTRAVENOUS

## 2022-02-20 MED ORDER — PHENYLEPHRINE 80 MCG/ML (10ML) SYRINGE FOR IV PUSH (FOR BLOOD PRESSURE SUPPORT)
PREFILLED_SYRINGE | INTRAVENOUS | Status: DC | PRN
  Administered 2022-02-20: 160 ug via INTRAVENOUS

## 2022-02-20 NOTE — Transfer of Care (Signed)
Immediate Anesthesia Transfer of Care Note  Patient: Terry Beltran  Procedure(s) Performed: TRANSESOPHAGEAL ECHOCARDIOGRAM (TEE) CARDIOVERSION  Patient Location: Endoscopy Unit  Anesthesia Type:MAC  Level of Consciousness: awake, alert , and oriented  Airway & Oxygen Therapy: Patient Spontanous Breathing  Post-op Assessment: Report given to RN and Post -op Vital signs reviewed and stable  Post vital signs: Reviewed and stable  Last Vitals:  Vitals Value Taken Time  BP 112/77   Temp    Pulse 66   Resp 1698   SpO2      Last Pain:  Vitals:   02/20/22 1243  TempSrc: Temporal  PainSc: 0-No pain      Patients Stated Pain Goal: 0 (96/72/89 7915)  Complications: No notable events documented.

## 2022-02-20 NOTE — Progress Notes (Signed)
Progress Note   Patient: Terry Beltran MGQ:[id number] DOB: [birth year] DOA: 02/17/2022     2 DOS: the patient was seen and examined on 02/20/2022   Brief hospital course: Mr. Ettinger was admitted to the hospital with the working diagnosis of decompensated heart failure.   67 yo male with the past medical history of hepatitis C, hypertension, dyslipidemia, and non Hodgkin's lymphoma sp chemotherapy 2016 now in remission who presented with dyspnea. Reported 2 weeks of dyspnea and lower extremity edema. He had worsening symptoms despite taking furosemide at home. On his initial physical examination his blood pressure was 128/92, HR 76, RR 27 and 02 saturation 98%, lungs with wheezing bilaterally, heart with S1 and S2 irregularly irregular, abdomen with no distention and positive lower extremity edema.   Chest radiograph with cardiomegaly with bilateral hilar vascular congestion, with bilateral interstitial infiltrates with cephalization of the vasculature.   CT chest with bilateral ground glass opacities, with small bilateral pleural effusion, positive preceptal emphysema.  No pulmonary embolism.   EKG 64 bpm, left axis deviation, right bundle branch block, atrial flutter with variable block, PVC, with no significant ST segment changes, negative T wave V5 and V6.   Patient was placed on furosemide for diuresis Anticoagulation with apixaban.   02/07 TEE cardioversion.   Assessment and Plan: * Acute on chronic systolic CHF (congestive heart failure) (HCC) Echocardiogram with reduced LV systolic function 26%, global hypokinesis. Moderate reduction in RV systolic function, RVSP 71.2 mmHg, moderate TR.   Urine output is 4,580 ml Systolic blood pressure is 115 to 117 mmHg.   Plan to continue medical therapy with empagliflozn, spironolactone, entresto and metoprolol.   New onset atrial flutter (HCC) Sp direct current cardioversion. Telemetry with sinus rhythm with 1st degree AV block.  Plan  to continue with amiodarone and metoprolol Continue telemetry monitoring Anticoagulation with apixaban.   Essential hypertension Continue blood pressure monitoring Continue with metoprolol and entresto.   Acute kidney injury superimposed on chronic kidney disease ([place]) CKD stage 2  Renal function with serum cr at 1,61 with K at 4,1 and serum bicarbonate at 27 Plan to hold on loop diuretic therapy for now and follow up renal function in am.   Mixed hyperlipidemia Continue statin therapy.   Diffuse large B-cell lymphoma, unspecified site ([place]) Follow up as outpatient.         Subjective: Patient is feeling better, dyspnea and edema have improved.   Physical Exam: Vitals:   02/20/22 1243 02/20/22 1425 02/20/22 1435 02/20/22 1502  BP: 117/81 112/70 95/65 (!) 115/90  Pulse: 65 96 92 95  Resp: '19 18 20 16  '$ Temp: 97.9 F (36.6 C) (!) 97 F (36.1 C)  97.6 F (36.4 C)  TempSrc: Temporal Temporal  Oral  SpO2: 95% 96% 93% 96%  Weight:      Height:       Neurology awake and alert ENT with mild pallor Cardiovascular with S1 and S2 present and rhythmic with no gallops or murmurs Respiratory with no rales or wheezing Abdomen with no distention  Trace lower extremity edema  Data Reviewed:    Family Communication: I spoke with patient's wife at the bedside, we talked in detail about patient's condition, plan of care and prognosis and all questions were addressed.   Disposition: Status is: Inpatient Remains inpatient appropriate because: telemetry monitoring post inpatient direct current cardioversion   Planned Discharge Destination: Home   Author: Tawni Millers, MD 02/20/2022 4:15 PM  For on call review [email or web address].

## 2022-02-20 NOTE — Plan of Care (Signed)
  Problem: Education: Goal: Knowledge of General Education information will improve Description: Including pain rating scale, medication(s)/side effects and non-pharmacologic comfort measures Outcome: Progressing   Problem: Clinical Measurements: Goal: Diagnostic test results will improve Outcome: Progressing Goal: Respiratory complications will improve Outcome: Progressing Goal: Cardiovascular complication will be avoided Outcome: Progressing   Problem: Activity: Goal: Risk for activity intolerance will decrease Outcome: Progressing   

## 2022-02-20 NOTE — Plan of Care (Signed)
  Problem: Education: Goal: Knowledge of General Education information will improve Description: Including pain rating scale, medication(s)/side effects and non-pharmacologic comfort measures Outcome: Progressing   Problem: Health Behavior/Discharge Planning: Goal: Ability to manage health-related needs will improve Outcome: Progressing   Problem: Clinical Measurements: Goal: Will remain free from infection Outcome: Progressing   

## 2022-02-20 NOTE — CV Procedure (Signed)
Transesophageal echocardiogram (TEE) : Preliminary report 02/20/22   Sedation: See anesthesia records.   TEE was performed without complications. Excessive coughing and excretions.    LV: Severely reduced  EF. RV: Visually appears mildly reduced.  LA: visually appears mildly dilated. Spontaneous echo contrast was present.  No thrombus present. Reduced emptying velocity.  Left atrial appendage: Spontaneous echo contrast was present.  No thrombus present. Inter atrial septum is intact not well visualized. RA: Grossly normal.    MV: Mild to moderate regurgitation, no stenosis.  TV: mild regurgitation,no stenosis. AV: Not well seen.  PV: Not well seen.    Thoracic and ascending aorta: Not visualized.    After evaluating the left atrial appendage and LV apex the study was terminated due to excessive coughing and increased excretions. Given the known reduced LVEF, atrial flutter, and acute heart failure wanted to reduce the anesthesia time.   Final report forthcoming.  Mechele [facility]  Pager: [phone] Office: [phone]  Direct current cardioversion:  Indications:  Atrial Flutter  Procedure Details:  Consent: Risks of procedure as well as the alternatives and risks of each were explained to the (patient/caregiver).  Consent for procedure obtained.  Time Out: Verified patient identification, verified procedure, site/side was marked, verified correct patient position, special equipment/implants available, medications/allergies/relevent history reviewed, required imaging and test results available. PERFORMED.  Patient placed on cardiac monitor, pulse oximetry, supplemental oxygen as necessary.  Sedation given:  see anesthesia records.  Pacer pads placed anterior and posterior chest.  Cardioverted 1 time(s).  Cardioversion with synchronized biphasic 150J shock.  Evaluation: Findings: Post procedure EKG shows: NSR Complications: None Patient did tolerate procedure  well.  Time Spent Directly with the Patient: Terry Beltran 02/20/2022, 2:16 PM

## 2022-02-20 NOTE — Progress Notes (Signed)
Mobility Specialist - Progress Note   02/20/22 1131  Mobility  Activity Ambulated with assistance in hallway  Level of Assistance Standby assist, set-up cues, supervision of patient - no hands on  Assistive Device Front wheel walker  Distance Ambulated (ft) 260 ft  Activity Response Tolerated well  Mobility Referral Yes  $Mobility charge 1 Mobility    Pt received in bed agreeable to mobility. Tolerated increased distance well w/ use of AD. No complaints throughout, took standing break x1. Left siting EOB w/ call bell in reach and all needs met.   [place] Specialist Please contact via SecureChat or Rehab office at [phone]

## 2022-02-20 NOTE — Anesthesia Preprocedure Evaluation (Addendum)
Anesthesia Evaluation  Patient identified by MRN, date of birth, ID band Patient awake    Reviewed: Allergy & Precautions, NPO status , Patient's Chart, lab work & pertinent test results  History of Anesthesia Complications Negative for: history of anesthetic complications  Airway Mallampati: III  TM Distance: >3 FB Neck ROM: Full    Dental  (+) Edentulous Upper, Dental Advisory Given   Pulmonary Current Smoker and Patient abstained from smoking.   breath sounds clear to auscultation       Cardiovascular hypertension, Pt. on medications (-) angina +CHF  + dysrhythmias Atrial Fibrillation  Rhythm:Irregular   1. Left ventricular ejection fraction, by estimation, is 25%. The left  ventricle has severely decreased function. The left ventricle demonstrates  global hypokinesis. The left ventricular internal cavity size was mildly  dilated. Left ventricular  diastolic parameters are indeterminate.   2. Right ventricular systolic function is moderately reduced. The right  ventricular size is mildly enlarged. There is mildly elevated pulmonary  artery systolic pressure. The estimated right ventricular systolic  pressure is 64.4 mmHg.   3. Left atrial size was mildly dilated.   4. Right atrial size was moderately dilated.   5. The mitral valve is normal in structure. Mild mitral valve  regurgitation. No evidence of mitral stenosis.   6. Tricuspid valve regurgitation is moderate.   7. The aortic valve is tricuspid. There is mild calcification of the  aortic valve. Aortic valve regurgitation is trivial. No aortic stenosis is  present.   8. Aortic dilatation noted. There is mild dilatation of the aortic root,  measuring 37 mm.   9. The inferior vena cava is dilated in size with <50% respiratory  variability, suggesting right atrial pressure of 15 mmHg.     Neuro/Psych negative neurological ROS  negative psych ROS    GI/Hepatic negative GI ROS, Neg liver ROS,,,  Endo/Other  negative endocrine ROS    Renal/GU Renal InsufficiencyRenal diseaseLab Results      Component                Value               Date                      CREATININE               1.61 (H)            02/20/2022                Musculoskeletal negative musculoskeletal ROS (+)    Abdominal   Peds  Hematology negative hematology ROS (+) Lab Results      Component                Value               Date                      WBC                      9.3                 02/20/2022                HGB                      13.8  02/20/2022                HCT                      41.5                02/20/2022                MCV                      97.9                02/20/2022                PLT                      261                 02/20/2022              Anesthesia Other Findings   Reproductive/Obstetrics                              Anesthesia Physical Anesthesia Plan  ASA: 3  Anesthesia Plan: MAC   Post-op Pain Management: Minimal or no pain anticipated   Induction: Intravenous  PONV Risk Score and Plan: 0 and Propofol infusion and Treatment may vary due to age or medical condition  Airway Management Planned: Nasal Cannula and Natural Airway  Additional Equipment: None  Intra-op Plan:   Post-operative Plan: Extubation in OR  Informed Consent: I have reviewed the patients History and Physical, chart, labs and discussed the procedure including the risks, benefits and alternatives for the proposed anesthesia with the patient or authorized representative who has indicated his/her understanding and acceptance.     Dental advisory given  Plan Discussed with: CRNA  Anesthesia Plan Comments:          Anesthesia Quick Evaluation

## 2022-02-20 NOTE — Assessment & Plan Note (Signed)
Follow up as outpatient.  

## 2022-02-20 NOTE — Hospital Course (Addendum)
Terry Beltran was admitted to the hospital with the working diagnosis of decompensated heart failure.   67 yo male with the past medical history of hepatitis C, hypertension, dyslipidemia, and non Hodgkin's lymphoma sp chemotherapy 2016 now in remission who presented with dyspnea. Reported 2 weeks of dyspnea and lower extremity edema. He had worsening symptoms despite taking furosemide at home. On his initial physical examination his blood pressure was 128/92, HR 76, RR 27 and 02 saturation 98%, lungs with wheezing bilaterally, heart with S1 and S2 irregularly irregular, abdomen with no distention and positive lower extremity edema.   Chest radiograph with cardiomegaly with bilateral hilar vascular congestion, with bilateral interstitial infiltrates with cephalization of the vasculature.   CT chest with bilateral ground glass opacities, with small bilateral pleural effusion, positive preceptal emphysema.  No pulmonary embolism.   EKG 64 bpm, left axis deviation, right bundle branch block, atrial flutter with variable block, PVC, with no significant ST segment changes, negative T wave V5 and V6.   Patient was placed on furosemide for diuresis Anticoagulation with apixaban.   02/07 TEE cardioversion.  02/08 clinically euvolemic, continue on sinus rhythm with 1st degree AV block.

## 2022-02-20 NOTE — Assessment & Plan Note (Signed)
Sp direct current cardioversion. Telemetry with sinus rhythm with 1st degree AV block.  Plan to continue with amiodarone and metoprolol Continue telemetry monitoring Anticoagulation with apixaban.

## 2022-02-20 NOTE — Assessment & Plan Note (Signed)
Continue blood pressure monitoring Continue with metoprolol and entresto.

## 2022-02-20 NOTE — Assessment & Plan Note (Signed)
Echocardiogram with reduced LV systolic function 73%, global hypokinesis. Moderate reduction in RV systolic function, RVSP 22.0 mmHg, moderate TR.   Urine output is 2,542 ml Systolic blood pressure is 115 to 117 mmHg.   Plan to continue medical therapy with empagliflozn, spironolactone, entresto and metoprolol.

## 2022-02-20 NOTE — Plan of Care (Signed)
  Problem: Activity: Goal: Risk for activity intolerance will decrease Outcome: Progressing   Problem: Coping: Goal: Level of anxiety will decrease Outcome: Progressing   Problem: Elimination: Goal: Will not experience complications related to urinary retention Outcome: Progressing   Problem: Safety: Goal: Ability to remain free from injury will improve Outcome: Progressing

## 2022-02-20 NOTE — Progress Notes (Signed)
Spoke the patient about transesophageal echocardiogram (TEE) guided cardioversion as he did not convert to sinus rhythm despite being on IV amiodarone.   After careful review of history and examination, the risks, benefits of transesophageal echocardiogram, and alternatives have been explained to the patient. Complications include but not limited to esophageal perforation (rare), gastrointestinal bleeding (rare), cardiac arrhythmia which can include cardiac arrest and death (rare), pharyngeal irritation / discomfort with swallowing / hematoma, methemoglobinemia, bronchospasm, transient hypoxia, nonsustained ventricular tachycardia, transient atrial fibrillation, minimal hemoptysis, vomiting, hypotension, respiratory compromise, reaction to medications, unavoidable damage to teeth and gums, aspiration pneumonia  were reviewed with the patient.  Patient voices understands, provides verbal feedback, questions answered, and patient wishes to proceed with the procedure.  Risks, benefits, and alternatives of direct current cardioversion reviewed with the and patient. Risk includes but not limited to: potential for post-cardioversion rhythms, life-threatening arrhythmias (ventricular tachycardia and fibrillation, profound bradycardia, cardiac arrest), myocardial damage, acute pulmonary edema, skin burns, transient hypotension. Benefits include restoration of sinus rhythm. Alternatives to treatment were discussed, questions were answered, patient voices understanding and provides verbal feedback.  Patient is willing to proceed.   Stopped by twice this morning to discuss the risks, benefits, alternatives for transesophageal echocardiogram (TEE) guided cardioversion with his wife Rosemarie Ax. However unable to meet [place] in person.  I called her personally at the phone number documented in his chart and reviewed the procedure with his wife as well.  Her questions and concerns addressed to the satisfaction.  She wishes  him to continue with the TEE guided cardioversion.  Rex Kras, [place], [facility]  Pager: [phone] Office: [phone]

## 2022-02-20 NOTE — Assessment & Plan Note (Signed)
CKD stage 2  Renal function with serum cr at 1,61 with K at 4,1 and serum bicarbonate at 27 Plan to hold on loop diuretic therapy for now and follow up renal function in am.

## 2022-02-20 NOTE — Assessment & Plan Note (Signed)
Continue statin therapy.

## 2022-02-20 NOTE — Anesthesia Procedure Notes (Signed)
Procedure Name: MAC Date/Time: 02/20/2022 2:00 PM  Performed by: Griffin Dakin, CRNAPre-anesthesia Checklist: Patient identified, Emergency Drugs available, Suction available, Patient being monitored and Timeout performed Patient Re-evaluated:Patient Re-evaluated prior to induction Oxygen Delivery Method: Nasal cannula Induction Type: IV induction Placement Confirmation: positive ETCO2 and breath sounds checked- equal and bilateral Dental Injury: Teeth and Oropharynx as per pre-operative assessment  Comments: [employer]

## 2022-02-20 NOTE — Interval H&P Note (Signed)
History and Physical Interval Note:  02/20/2022 1:28 PM  Terry Beltran  has presented today for surgery, with the diagnosis of aflutter.  The various methods of treatment have been discussed with the patient and family. After consideration of risks, benefits and other options for treatment, the patient has consented to  Procedure(s): TRANSESOPHAGEAL ECHOCARDIOGRAM (TEE) (N/A) CARDIOVERSION (N/A) as a surgical intervention.  The patient's history has been reviewed, patient examined, no change in status, stable for surgery.  I have reviewed the patient's chart and labs.  Questions were answered to the patient's satisfaction.    Spoke to his wife as well this morning.    Rex Kras, [place], [facility]  Pager: [phone] Office: [phone]

## 2022-02-21 ENCOUNTER — Other Ambulatory Visit (HOSPITAL_COMMUNITY): Payer: Self-pay

## 2022-02-21 ENCOUNTER — Telehealth: Payer: Self-pay

## 2022-02-21 DIAGNOSIS — I4892 Unspecified atrial flutter: Secondary | ICD-10-CM

## 2022-02-21 DIAGNOSIS — E669 Obesity, unspecified: Secondary | ICD-10-CM | POA: Insufficient documentation

## 2022-02-21 DIAGNOSIS — E66812 Obesity, class 2: Secondary | ICD-10-CM | POA: Insufficient documentation

## 2022-02-21 DIAGNOSIS — I453 Trifascicular block: Secondary | ICD-10-CM

## 2022-02-21 LAB — BASIC METABOLIC PANEL
Anion gap: 8 (ref 5–15)
BUN: 17 mg/dL (ref 8–23)
CO2: 28 mmol/L (ref 22–32)
Calcium: 8.7 mg/dL — ABNORMAL LOW (ref 8.9–10.3)
Chloride: 99 mmol/L (ref 98–111)
Creatinine, Ser: 1.46 mg/dL — ABNORMAL HIGH (ref 0.61–1.24)
GFR, Estimated: 52 mL/min — ABNORMAL LOW (ref >60)
Glucose, Bld: 112 mg/dL — ABNORMAL HIGH (ref 70–99)
Potassium: 3.8 mmol/L (ref 3.5–5.1)
Sodium: 135 mmol/L (ref 135–145)

## 2022-02-21 LAB — MAGNESIUM: Magnesium: 1.9 mg/dL (ref 1.7–2.4)

## 2022-02-21 MED ORDER — SACUBITRIL-VALSARTAN 24-26 MG PO TABS
1.0000 | ORAL_TABLET | Freq: Two times a day (BID) | ORAL | 0 refills | Status: DC
  Filled 2022-02-21: qty 60, 30d supply, fill #0

## 2022-02-21 MED ORDER — FUROSEMIDE 20 MG PO TABS
20.0000 mg | ORAL_TABLET | Freq: Every day | ORAL | 0 refills | Status: DC
  Filled 2022-02-21: qty 30, 30d supply, fill #0

## 2022-02-21 MED ORDER — DAPAGLIFLOZIN PROPANEDIOL 10 MG PO TABS
10.0000 mg | ORAL_TABLET | Freq: Every day | ORAL | 0 refills | Status: DC
  Filled 2022-02-21: qty 30, 30d supply, fill #0

## 2022-02-21 MED ORDER — APIXABAN 5 MG PO TABS
5.0000 mg | ORAL_TABLET | Freq: Two times a day (BID) | ORAL | 0 refills | Status: DC
  Filled 2022-02-21: qty 60, 30d supply, fill #0

## 2022-02-21 MED ORDER — MAGNESIUM OXIDE -MG SUPPLEMENT 400 (240 MG) MG PO TABS
400.0000 mg | ORAL_TABLET | Freq: Two times a day (BID) | ORAL | Status: DC
  Administered 2022-02-21: 400 mg via ORAL
  Filled 2022-02-21: qty 1

## 2022-02-21 MED ORDER — METOPROLOL SUCCINATE ER 25 MG PO TB24
25.0000 mg | ORAL_TABLET | Freq: Every day | ORAL | 0 refills | Status: DC
  Filled 2022-02-21: qty 30, 30d supply, fill #0

## 2022-02-21 MED ORDER — MAGNESIUM OXIDE 400 MG PO TABS
400.0000 mg | ORAL_TABLET | Freq: Two times a day (BID) | ORAL | 0 refills | Status: DC
  Filled 2022-02-21: qty 30, 15d supply, fill #0

## 2022-02-21 NOTE — Assessment & Plan Note (Signed)
Calculated BMI 37.7

## 2022-02-21 NOTE — Discharge Summary (Signed)
Physician Discharge Summary   Patient: Terry Beltran MRN: [id number] DOB: 11/13/1955  Admit date:     02/17/2022  Discharge date: 02/21/22  Discharge Physician: Terry Beltran   PCP: Terry Mainland, FNP   Recommendations at discharge:    Patient will continue furosemide 20 mg daily and added empagliflozin for further diuresis. Guideline directed therapy with entresto and metoprolol succinate.  Follow up renal function in 7 days as outpatient.  Started on apixaban for anticoagulation for atrial flutter.  Added Mag oxide for ectopy.   Discharge Diagnoses: Principal Problem:   Acute on chronic systolic CHF (congestive heart failure) (HCC) Active Problems:   New onset atrial flutter (HCC)   Essential hypertension   Acute kidney injury superimposed on chronic kidney disease ([place])   Mixed hyperlipidemia   Diffuse large B-cell lymphoma, unspecified site ([place])   Class 2 obesity  Resolved Problems:   New onset of congestive heart failure [facility])  Hospital Course: [name] was admitted to the hospital with the working diagnosis of decompensated heart failure.   67 yo male with the past medical history of hepatitis C, hypertension, dyslipidemia, and non Hodgkin's lymphoma sp chemotherapy 2016 now in remission who presented with dyspnea. Reported 2 weeks of dyspnea and lower extremity edema. He had worsening symptoms despite taking furosemide at home. On his initial physical examination his blood pressure was 128/92, HR 76, RR 27 and 02 saturation 98%, lungs with wheezing bilaterally, heart with S1 and S2 irregularly irregular, abdomen with no distention and positive lower extremity edema.   Chest radiograph with cardiomegaly with bilateral hilar vascular congestion, with bilateral interstitial infiltrates with cephalization of the vasculature.   CT chest with bilateral ground glass opacities, with small bilateral pleural effusion, positive preceptal emphysema.  No pulmonary  embolism.   EKG 64 bpm, left axis deviation, right bundle branch block, atrial flutter with variable block, PVC, with no significant ST segment changes, negative T wave V5 and V6.   Patient was placed on furosemide for diuresis Anticoagulation with apixaban.   02/07 TEE cardioversion.  02/08 clinically euvolemic, continue on sinus rhythm with 1st degree AV block.   Assessment and Plan: * Acute on chronic systolic CHF (congestive heart failure) (HCC) Echocardiogram with reduced LV systolic function 69%, global hypokinesis. Moderate reduction in RV systolic function, RVSP 67.8 mmHg, moderate TR.   Patient was placed on IV furosemide for diuresis, negative fluid balance was achieved - 4,830 ml, with significant improvement in his symptoms.   Plan to continue medical therapy with empagliflozn, spironolactone, entresto and metoprolol.  Resume oral furosemide 20 mg daily.   New onset atrial flutter (HCC) Sp direct current cardioversion. Telemetry with sinus rhythm with 1st degree AV block, rate 80 to 90 bpm.   Plan to continue with metoprolol. Amiodarone was discontinued.  Added magnesium oxide, to reduce ectopy.  Anticoagulation with apixaban.   Essential hypertension Continue blood pressure control with metoprolol and entresto.   Acute kidney injury superimposed on chronic kidney disease ([place]) CKD stage 2  At the time of his discharge his renal function is improving with serum cr at 1,46 with K at 3,8 and serum bicarbonate at 28. Plan to continue SGLT 2 inh and furosemide.  Follow up renal function as outpatient.   Mixed hyperlipidemia Continue statin therapy.   Diffuse large B-cell lymphoma, unspecified site ([place]) Follow up as outpatient.   Class 2 obesity Calculated BMI 37.7         Consultants: cardiology  Procedures performed: direct  current cardioversion   Disposition: Home Diet recommendation:  Cardiac diet DISCHARGE MEDICATION: Allergies as of 02/21/2022    No Known Allergies      Medication List     STOP taking these medications    amLODipine 10 MG tablet Commonly known as: NORVASC   meloxicam 15 MG tablet Commonly known as: MOBIC   metoprolol tartrate 25 MG tablet Commonly known as: LOPRESSOR   sucralfate 1 g tablet Commonly known as: Carafate       TAKE these medications    apixaban 5 MG Tabs tablet Commonly known as: ELIQUIS Take 1 tablet (5 mg total) by mouth 2 (two) times daily.   atorvastatin 20 MG tablet Commonly known as: LIPITOR Take 1 tablet by mouth daily.   dapagliflozin propanediol 10 MG Tabs tablet Commonly known as: FARXIGA Take 1 tablet (10 mg total) by mouth daily. Start taking on: February 22, 2022   famotidine 20 MG tablet Commonly known as: PEPCID Take 1 tablet (20 mg total) by mouth 2 (two) times daily.   furosemide 20 MG tablet Commonly known as: LASIX Take 1 tablet (20 mg total) by mouth daily.   gabapentin 300 MG capsule Commonly known as: NEURONTIN Take 1 capsule by mouth at bedtime.   magnesium oxide 400 (240 Mg) MG tablet Commonly known as: MAG-OX Take 1 tablet (400 mg total) by mouth 2 (two) times daily for 15 days.   metoprolol succinate 25 MG 24 hr tablet Commonly known as: TOPROL-XL Take 1 tablet (25 mg total) by mouth daily. Start taking on: February 22, 2022   pantoprazole 20 MG tablet Commonly known as: PROTONIX Take 1 tablet (20 mg total) by mouth daily.   sacubitril-valsartan 24-26 MG Commonly known as: ENTRESTO Take 1 tablet by mouth 2 (two) times daily.        Discharge Exam: Filed Weights   02/19/22 0605 02/20/22 0538 02/21/22 0629  Weight: 121.7 kg 120 kg 119.2 kg   BP (!) 110/98 (BP Location: Right Arm)   Pulse 100   Temp 97.7 F (36.5 C) (Oral)   Resp 16   Ht '5\' 10"'$  (1.778 m)   Wt 119.2 kg   SpO2 95%   BMI 37.71 kg/m   Patient with improvement in his symptoms with no dyspnea or edema. No chest pain or palpitations.   Neurology awake and  alert ENT with no pallor Cardiovascular with S1 and S2 present and rhythmic with no gallops, rubs or murmurs No JVD No lower extremity edema Respiratory with no rales or wheezing Abdomen with no distention  Condition at discharge: stable  The results of significant diagnostics from this hospitalization (including imaging, microbiology, ancillary and laboratory) are listed below for reference.   Imaging Studies: VAS [place] LOWER EXTREMITY VENOUS (DVT)  Result Date: 02/18/2022  Lower Venous DVT Study Patient Name:  SRIHARI SHELLHAMMER  Date of Exam:   02/18/2022 Medical Rec #: [id number]         Accession #:    [id number] Date of Birth: [birth year]         Patient Gender: M Patient Age:   17 years Exam Location:  [facility] Procedure:      VAS [place] LOWER EXTREMITY VENOUS (DVT) Referring Phys: Sheppard Coil MELVIN --------------------------------------------------------------------------------  Indications: Edema.  Comparison Study: No previous exams Performing Technologist: Jody Hill RVT, RDMS  Examination Guidelines: A complete evaluation includes B-mode imaging, spectral Doppler, color Doppler, and power Doppler as needed of all accessible portions of each vessel. Bilateral testing is  considered an integral part of a complete examination. Limited examinations for reoccurring indications may be performed as noted. The reflux portion of the exam is performed with the patient in reverse Trendelenburg.  +---------+---------------+---------+-----------+----------+-------------------+ RIGHT    CompressibilityPhasicitySpontaneityPropertiesThrombus Aging      +---------+---------------+---------+-----------+----------+-------------------+ CFV      Full           No       Yes                                      +---------+---------------+---------+-----------+----------+-------------------+ SFJ      Full                                                              +---------+---------------+---------+-----------+----------+-------------------+ FV Prox  Full           No       Yes                                      +---------+---------------+---------+-----------+----------+-------------------+ FV Mid   Full           No       Yes                                      +---------+---------------+---------+-----------+----------+-------------------+ FV DistalFull           No       Yes                                      +---------+---------------+---------+-----------+----------+-------------------+ PFV      Full                                                             +---------+---------------+---------+-----------+----------+-------------------+ POP      Full           No       Yes                                      +---------+---------------+---------+-----------+----------+-------------------+ PTV      Full                                                             +---------+---------------+---------+-----------+----------+-------------------+ PERO     Full  Not well visualized +---------+---------------+---------+-----------+----------+-------------------+ pulsatile flow  +----+---------------+---------+-----------+----------+--------------+ LEFTCompressibilityPhasicitySpontaneityPropertiesThrombus Aging +----+---------------+---------+-----------+----------+--------------+ CFV Full           No       Yes                                 +----+---------------+---------+-----------+----------+--------------+ pulsatile flow    Summary: RIGHT: - There is no evidence of deep vein thrombosis in the lower extremity.  - No cystic structure found in the popliteal fossa.  LEFT: - No evidence of common femoral vein obstruction.  *See table(s) above for measurements and observations. Electronically signed by Harold Barban MD on 02/18/2022 at 10:09:38 PM.    Final     ECHOCARDIOGRAM COMPLETE  Result Date: 02/18/2022    ECHOCARDIOGRAM REPORT   Patient Name:   MESHILEM MACHUCA Date of Exam: 02/18/2022 Medical Rec #:  [id number]        Height:       70.0 in Accession #:    [id number]       Weight:       278.0 lb Date of Birth:  [birth year]        BSA:          2.401 m Patient Age:    28 years         BP:           135/95 mmHg Patient Gender: M                HR:           62 bpm. Exam Location:  Inpatient Procedure: 2D Echo, Cardiac Doppler and Color Doppler Indications:    CHF-Acute Systolic X10.62  History:        Patient has no prior history of Echocardiogram examinations.                 CHF, Arrythmias:Atrial Flutter; Risk Factors:Hypertension and                 Dyslipidemia. Cancer.  Sonographer:    Ronny Flurry Referring Phys: [id number] Candace Gallus MELVIN  Sonographer Comments: Image acquisition challenging due to respiratory motion. IMPRESSIONS  1. Left ventricular ejection fraction, by estimation, is 25%. The left ventricle has severely decreased function. The left ventricle demonstrates global hypokinesis. The left ventricular internal cavity size was mildly dilated. Left ventricular diastolic parameters are indeterminate.  2. Right ventricular systolic function is moderately reduced. The right ventricular size is mildly enlarged. There is mildly elevated pulmonary artery systolic pressure. The estimated right ventricular systolic pressure is 27.0 mmHg.  3. Left atrial size was mildly dilated.  4. Right atrial size was moderately dilated.  5. The mitral valve is normal in structure. Mild mitral valve regurgitation. No evidence of mitral stenosis.  6. Tricuspid valve regurgitation is moderate.  7. The aortic valve is tricuspid. There is mild calcification of the aortic valve. Aortic valve regurgitation is trivial. No aortic stenosis is present.  8. Aortic dilatation noted. There is mild dilatation of the aortic root, measuring 37 mm.  9. The inferior vena cava is  dilated in size with <50% respiratory variability, suggesting right atrial pressure of 15 mmHg. FINDINGS  Left Ventricle: Left ventricular ejection fraction, by estimation, is 25%. The left ventricle has severely decreased function. The left ventricle demonstrates global hypokinesis. The left ventricular internal cavity size was mildly dilated. There is no left ventricular hypertrophy. Left ventricular diastolic parameters are indeterminate.  Right Ventricle: The right ventricular size is mildly enlarged. No increase in right ventricular wall thickness. Right ventricular systolic function is moderately reduced. There is mildly elevated pulmonary artery systolic pressure. The tricuspid regurgitant velocity is 2.51 m/s, and with an assumed right atrial pressure of 15 mmHg, the estimated right ventricular systolic pressure is 81.8 mmHg. Left Atrium: Left atrial size was mildly dilated. Right Atrium: Right atrial size was moderately dilated. Pericardium: There is no evidence of pericardial effusion. Mitral Valve: The mitral valve is normal in structure. Mild mitral valve regurgitation. No evidence of mitral valve stenosis. Tricuspid Valve: The tricuspid valve is normal in structure. Tricuspid valve regurgitation is moderate. Aortic Valve: The aortic valve is tricuspid. There is mild calcification of the aortic valve. Aortic valve regurgitation is trivial. No aortic stenosis is present. Aortic valve mean gradient measures 3.0 mmHg. Aortic valve peak gradient measures 4.8 mmHg. Aortic valve area, by VTI measures 2.72 cm. Pulmonic Valve: The pulmonic valve was normal in structure. Pulmonic valve regurgitation is trivial. Aorta: Aortic dilatation noted. There is mild dilatation of the aortic root, measuring 37 mm. Venous: The inferior vena cava is dilated in size with less than 50% respiratory variability, suggesting right atrial pressure of 15 mmHg. IAS/Shunts: No atrial level shunt detected by color flow Doppler.  LEFT  VENTRICLE PLAX 2D LVIDd:         5.50 cm   Diastology LVIDs:         4.60 cm   LV e' medial:    7.49 cm/s LV PW:         1.20 cm   LV E/e' medial:  10.0 LV IVS:        0.80 cm   LV e' lateral:   11.10 cm/s LVOT diam:     2.30 cm   LV E/e' lateral: 6.8 LV SV:         46 LV SV Index:   19 LVOT Area:     4.15 cm  RIGHT VENTRICLE            IVC RV S prime:     4.26 cm/s  IVC diam: 2.40 cm TAPSE (M-mode): 0.6 cm LEFT ATRIUM             Index        RIGHT ATRIUM           Index LA diam:        4.90 cm 2.04 cm/m   RA Area:     30.20 cm LA Vol (A2C):   90.1 ml 37.53 ml/m  RA Volume:   107.00 ml 44.57 ml/m LA Vol (A4C):   92.0 ml 38.32 ml/m LA Biplane Vol: 92.2 ml 38.40 ml/m  AORTIC VALVE AV Area (Vmax):    2.73 cm AV Area (Vmean):   2.72 cm AV Area (VTI):     2.72 cm AV Vmax:           109.00 cm/s AV Vmean:          75.450 cm/s AV VTI:            0.168 m AV Peak Grad:      4.8 mmHg AV Mean Grad:      3.0 mmHg LVOT Vmax:         71.50 cm/s LVOT Vmean:        49.400 cm/s LVOT VTI:          0.110 m LVOT/AV VTI ratio: 0.66  AORTA Ao Root diam:  3.70 cm Ao Asc diam:  3.50 cm MITRAL VALVE               TRICUSPID VALVE MV Area (PHT): 5.13 cm    TR Peak grad:   25.2 mmHg MV Decel Time: 148 msec    TR Vmax:        251.00 cm/s MR Peak grad: 59.9 mmHg MR Vmax:      387.00 cm/s  SHUNTS MV E velocity: 75.20 cm/s  Systemic VTI:  0.11 m MV A velocity: 33.80 cm/s  Systemic Diam: 2.30 cm MV E/A ratio:  2.22 Dalton McleanMD Electronically signed by Franki Monte Signature Date/Time: 02/18/2022/1:31:36 PM    Final    CT Angio Chest PE W/Cm &/Or Wo Cm  Result Date: 02/17/2022 CLINICAL DATA:  Shortness of breath and swelling of the right leg. EXAM: CT ANGIOGRAPHY CHEST WITH CONTRAST TECHNIQUE: Multidetector CT imaging of the chest was performed using the standard protocol during bolus administration of intravenous contrast. Multiplanar CT image reconstructions and MIPs were obtained to evaluate the vascular anatomy. RADIATION DOSE  REDUCTION: This exam was performed according to the departmental dose-optimization program which includes automated exposure control, adjustment of the mA and/or kV according to patient size and/or use of iterative reconstruction technique. CONTRAST:  37m OMNIPAQUE IOHEXOL 350 MG/ML SOLN COMPARISON:  CT examination dated May 07, 2013 FINDINGS: Cardiovascular: Satisfactory opacification of the pulmonary arteries to the segmental level. No evidence of pulmonary embolism. Normal enlarged. No pericardial effusion. Reflux of contrast into the hepatic veins suggesting right heart failure. Mediastinum/Nodes: No enlarged mediastinal, hilar, or axillary lymph nodes. Thyroid gland, trachea, and esophagus demonstrate no significant findings. Lungs/Pleura: Biapical pleural/parenchymal scarring with large subpleural and centrilobular emphysematous bulla. Small bilateral pleural effusions, right greater than the left. Mild bibasilar atelectasis. No evidence of pneumonia. Upper Abdomen: No acute abnormality. Prominent left adrenal gland suggesting adrenal hyperplasia. Musculoskeletal: Degenerate disc disease with prominent anterior osteophytes. No acute osseous abnormality Review of the MIP images confirms the above findings. IMPRESSION: 1. No evidence of pulmonary embolism. 2. Small bilateral pleural effusions, right greater than left with associated bibasilar atelectasis. 3. Biapical pleural/parenchymal scarring with large subpleural and centrilobular emphysematous bulla. 4. Heart is enlarged. Reflux of contrast into the hepatic veins suggesting right heart failure. 5. No evidence of pneumonia or pulmonary edema. 6. 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 No acute osseous abnormality. 7. Prominent left adrenal gland suggesting adrenal hyperplasia. Emphysema (ICD10-J43.9). Electronically Signed   By: IKeane PoliceD.O.   On: 02/17/2022 13:19   DG Chest Port 1 View  Result Date: 02/17/2022 CLINICAL DATA:  Shortness of  breath. EXAM: PORTABLE CHEST 1 VIEW COMPARISON:  CXR 02/11/21 FINDINGS: No pleural effusion. No pneumothorax. Compared to prior exam there is interval increase in size of the cardiac contours, which could suggest presence of a pericardial effusion. There are prominent bilateral interstitial opacities, suggestive of either atypical infection or pulmonary venous congestion. No radiographically apparent displaced rib fractures. No focal airspace opacity. IMPRESSION: 1. Compared to prior exam there is interval increase in size of the cardiac contours, which could suggest presence of a pericardial effusion. Correlate with echocardiography. 2. Prominent bilateral interstitial opacities, suggestive of either atypical infection or pulmonary venous congestion. Electronically Signed   By: HMarin RobertsM.D.   On: 02/17/2022 11:34    Microbiology: Results for orders placed or performed during the hospital encounter of 02/17/22  Resp panel by RT-PCR (RSV, Flu A&B, Covid) Anterior Nasal Swab  Status: None   Collection Time: 02/17/22 11:13 AM   Specimen: Anterior Nasal Swab  Result Value Ref Range Status   SARS Coronavirus 2 by RT PCR NEGATIVE NEGATIVE Final    Comment: (NOTE) SARS-CoV-2 target nucleic acids are NOT DETECTED.  The SARS-CoV-2 RNA is generally detectable in upper respiratory specimens during the acute phase of infection. The lowest concentration of SARS-CoV-2 viral copies this assay can detect is 138 copies/mL. A negative result does not preclude SARS-Cov-2 infection and should not be used as the sole basis for treatment or other patient management decisions. A negative result may occur with  improper specimen collection/handling, submission of specimen other than nasopharyngeal swab, presence of viral mutation(s) within the areas targeted by this assay, and inadequate number of viral copies(<138 copies/mL). A negative result must be combined with clinical observations, patient history, and  epidemiological information. The expected result is Negative.  Fact Sheet for Patients:  EntrepreneurPulse.com.au  Fact Sheet for Healthcare Providers:  IncredibleEmployment.be  This test is no t yet approved or cleared by the [place] FDA and  has been authorized for detection and/or diagnosis of SARS-CoV-2 by FDA under an Emergency Use Authorization (EUA). This EUA will remain  in effect (meaning this test can be used) for the duration of the COVID-19 declaration under Section 564(b)(1) of the Act, 21 U.S.C.section 360bbb-3(b)(1), unless the authorization is terminated  or revoked sooner.       Influenza A by PCR NEGATIVE NEGATIVE Final   Influenza B by PCR NEGATIVE NEGATIVE Final    Comment: (NOTE) The Xpert Xpress SARS-CoV-2/FLU/RSV plus assay is intended as an aid in the diagnosis of influenza from Nasopharyngeal swab specimens and should not be used as a sole basis for treatment. Nasal washings and aspirates are unacceptable for Xpert Xpress SARS-CoV-2/FLU/RSV testing.  Fact Sheet for Patients: EntrepreneurPulse.com.au  Fact Sheet for Healthcare Providers: IncredibleEmployment.be  This test is not yet approved or cleared by the [place] FDA and has been authorized for detection and/or diagnosis of SARS-CoV-2 by FDA under an Emergency Use Authorization (EUA). This EUA will remain in effect (meaning this test can be used) for the duration of the COVID-19 declaration under Section 564(b)(1) of the Act, 21 U.S.C. section 360bbb-3(b)(1), unless the authorization is terminated or revoked.     Resp Syncytial Virus by PCR NEGATIVE NEGATIVE Final    Comment: (NOTE) Fact Sheet for Patients: EntrepreneurPulse.com.au  Fact Sheet for Healthcare Providers: IncredibleEmployment.be  This test is not yet approved or cleared by the [place] FDA and has been  authorized for detection and/or diagnosis of SARS-CoV-2 by FDA under an Emergency Use Authorization (EUA). This EUA will remain in effect (meaning this test can be used) for the duration of the COVID-19 declaration under Section 564(b)(1) of the Act, 21 U.S.C. section 360bbb-3(b)(1), unless the authorization is terminated or revoked.  Performed at [facility], [address] [place], [place] [address]   Culture, blood (routine x 2)     Status: None (Preliminary result)   Collection Time: 02/17/22 11:27 AM   Specimen: BLOOD  Result Value Ref Range Status   Specimen Description   Final    BLOOD LEFT ANTECUBITAL Performed at [facility], [address] [place], [place] [address]    Special Requests   Final    BOTTLES DRAWN AEROBIC AND ANAEROBIC Blood Culture adequate volume Performed at [facility], [address] [place], [place] [address]  Culture   Final    NO GROWTH 4 DAYS Performed at [facility], [place] [address] [place], [place] [address]    Report Status PENDING  Incomplete  Culture, blood (routine x 2)     Status: None (Preliminary result)   Collection Time: 02/17/22 11:27 AM   Specimen: BLOOD LEFT HAND  Result Value Ref Range Status   Specimen Description   Final    BLOOD LEFT HAND Performed at [facility], [address] [place], [place] [address]    Special Requests   Final    BOTTLES DRAWN AEROBIC AND ANAEROBIC Blood Culture adequate volume Performed at [facility], [address] [place], [place] [address]    Culture   Final    NO GROWTH 4 DAYS Performed at [facility], [place] [address] [place], [place] [address]    Report Status PENDING  Incomplete    Labs: CBC: Recent Labs  Lab 02/17/22 1113 02/18/22 0028 02/20/22 0055  WBC 8.7 9.7 9.3  NEUTROABS 6.2  --   --   HGB 12.2* 12.4* 13.8  HCT 37.7* 39.6 41.5  MCV 98.4 100.3* 97.9  PLT 248 243 470   Basic Metabolic  Panel: Recent Labs  Lab 02/17/22 1113 02/18/22 0028 02/19/22 0040 02/20/22 0055 02/21/22 0043  NA 137 141 138 137 135  K 3.8 4.4 3.4* 4.1 3.8  CL 105 102 100 101 99  CO2 '25 26 26 27 28  '$ GLUCOSE 192* 103* 101* 110* 112*  BUN '16 15 15 19 17  '$ CREATININE 1.29* 1.39* 1.35* 1.61* 1.46*  CALCIUM 8.8* 9.6 9.2 9.1 8.7*  MG 1.7 2.1  --  1.8 1.9   Liver Function Tests: Recent Labs  Lab 02/18/22 0028  AST 23  ALT 16  ALKPHOS 64  BILITOT 0.7  PROT 7.2  ALBUMIN 3.4*   CBG: No results for input(s): "GLUCAP" in the last 168 hours.  Discharge time spent: greater than 30 minutes.  Signed: Tawni Millers, MD [facility] 02/21/2022

## 2022-02-21 NOTE — Anesthesia Postprocedure Evaluation (Signed)
Anesthesia Post Note  Patient: Terry Beltran  Procedure(s) Performed: TRANSESOPHAGEAL ECHOCARDIOGRAM (TEE) CARDIOVERSION     Patient location during evaluation: Endoscopy Anesthesia Type: General Level of consciousness: awake and patient cooperative Pain management: pain level controlled Vital Signs Assessment: post-procedure vital signs reviewed and stable Respiratory status: spontaneous breathing, nonlabored ventilation and respiratory function stable Cardiovascular status: stable and blood pressure returned to baseline Postop Assessment: no apparent nausea or vomiting Anesthetic complications: no   No notable events documented.  Last Vitals:  Vitals:   02/21/22 0829 02/21/22 0944  BP: 104/75 (!) 110/98  Pulse: 92 100  Resp: 18 16  Temp: 36.5 C   SpO2: 96% 95%    Last Pain:  Vitals:   02/21/22 1220  TempSrc:   PainSc: 0-No pain                 Willia Genrich

## 2022-02-21 NOTE — TOC Transition Note (Addendum)
Transition of Care [facility]) - CM/SW Discharge Note   Patient Details  Name: Jayjay Littles MRN: [id number] Date of Birth: 15-Feb-1955  [facility] CM/SW Contact:  Zenon Mayo, RN Phone Number: 02/21/2022, 12:51 PM   Clinical Narrative:    Patient is for dc today, he has zero co pay for eliquis and entresto.   He has transport. MD want to see how his HRate does with activity before he goes.  His PCP is with [facility] and he will make the follow up apt per patient.  TOC to fill meds.         Patient Goals and CMS Choice      Discharge Placement                         Discharge Plan and Services Additional resources added to the After Visit Summary for                                       Social Determinants of Health (SDOH) Interventions SDOH Screenings   Food Insecurity: No Food Insecurity (02/17/2022)  Housing: Low Risk  (02/17/2022)  Transportation Needs: No Transportation Needs (02/17/2022)  Utilities: Not At Risk (02/17/2022)  Tobacco Use: High Risk (02/17/2022)     Readmission Risk Interventions     No data to display

## 2022-02-21 NOTE — Telephone Encounter (Signed)
Patient is still admitted in the hospital.

## 2022-02-21 NOTE — Progress Notes (Signed)
Progress Note  Patient Name: Terry Beltran MRN: [id number] DOB: 1955-09-21 Date of Encounter: 02/21/2022  Attending physician: No att. providers found Primary care provider: Karie Mainland, FNP  Subjective: Terry Beltran is a 67 y.o. African-American male who was seen and examined at bedside  Shortness of breath is significantly improved. Denies anginal discomfort. No events overnight. Wife present at bedside Case discussed and reviewed with his nurse.  Objective: Vital Signs in the last 24 hours: Temp:  [97.7 F (36.5 C)-97.8 F (36.6 C)] 97.7 F (36.5 C) (02/08 0829) Pulse Rate:  [92-100] 100 (02/08 0944) Resp:  [16-18] 16 (02/08 0944) BP: (103-110)/(56-98) 110/98 (02/08 0944) SpO2:  [93 %-96 %] 95 % (02/08 0944) Weight:  [509.3 kg] 119.2 kg (02/08 0629)  Intake/Output:  Intake/Output Summary (Last 24 hours) at 02/21/2022 2011 Last data filed at 02/21/2022 1220 Gross per 24 hour  Intake 480 ml  Output 1500 ml  Net -1020 ml    Net IO Since Admission: -4,965.38 mL [02/21/22 2011]  Weights:     02/21/2022    6:29 AM 02/20/2022    5:38 AM 02/19/2022    6:05 AM  Last 3 Weights  Weight (lbs) 262 lb 12.6 oz 264 lb 9.6 oz 268 lb 3.2 oz  Weight (kg) 119.2 kg 120.022 kg 121.655 kg      Telemetry:  Overnight telemetry shows sinus rhythm with ectopy and occasional second-degree type I Mobitz, which I personally reviewed.   Physical examination: PHYSICAL EXAM: Vitals:   02/20/22 1920 02/21/22 0629 02/21/22 0829 02/21/22 0944  BP: 111/69 (!) 103/56 104/75 (!) 110/98  Pulse: 95 93 92 100  Resp: '18 18 18 16  '$ Temp: 98.2 F (36.8 C) 97.8 F (36.6 C) 97.7 F (36.5 C)   TempSrc: Oral Oral Oral   SpO2: 93% 93% 96% 95%  Weight:  119.2 kg    Height:        Physical Exam  Constitutional: No distress.  Age appropriate, hemodynamically stable.   Cardiovascular: Normal rate, regular rhythm, S1 normal, S2 normal, intact distal pulses and normal pulses. Exam reveals no  gallop, no S3 and no S4.  No murmur heard. Pulmonary/Chest: Effort normal and breath sounds normal. No stridor. He has no wheezes. He has no rales.  Abdominal: Soft. Bowel sounds are normal. He exhibits no distension. There is no abdominal tenderness.  Musculoskeletal:        General: Edema (Trace bilateral) present.     Cervical back: Neck supple.  Neurological: He is alert and oriented to person, place, and time. He has intact cranial nerves (2-12).  Skin: Skin is warm and moist.   Lab Results: Chemistry Recent Labs  Lab 02/18/22 0028 02/19/22 0040 02/20/22 0055 02/21/22 0043  NA 141 138 137 135  K 4.4 3.4* 4.1 3.8  CL 102 100 101 99  CO2 '26 26 27 28  '$ GLUCOSE 103* 101* 110* 112*  BUN '15 15 19 17  '$ CREATININE 1.39* 1.35* 1.61* 1.46*  CALCIUM 9.6 9.2 9.1 8.7*  PROT 7.2  --   --   --   ALBUMIN 3.4*  --   --   --   AST 23  --   --   --   ALT 16  --   --   --   ALKPHOS 64  --   --   --   BILITOT 0.7  --   --   --   GFRNONAA 55* 57* 46* 52*  ANIONGAP '13 12 9 '$ 8  Hematology Recent Labs  Lab 02/17/22 1113 02/18/22 0028 02/20/22 0055  WBC 8.7 9.7 9.3  RBC 3.83* 3.95* 4.24  HGB 12.2* 12.4* 13.8  HCT 37.7* 39.6 41.5  MCV 98.4 100.3* 97.9  MCH 31.9 31.4 32.5  MCHC 32.4 31.3 33.3  RDW 16.5* 16.5* 16.3*  PLT 248 243 261   High Sensitivity Troponin:   Recent Labs  Lab 02/17/22 1113 02/17/22 1313  TROPONINIHS 40* 41*     Cardiac EnzymesNo results for input(s): "TROPONINI" in the last 168 hours. No results for input(s): "TROPIPOC" in the last 168 hours.  BNP Recent Labs  Lab 02/17/22 1113 02/19/22 0040 02/20/22 0055  BNP 681.8* 791.5* 441.6*    DDimer  Recent Labs  Lab 02/17/22 1113  DDIMER 1.66*    Hemoglobin A1c:  Lab Results  Component Value Date   HGBA1C 5.8 (H) 02/19/2022   MPG 119.76 02/19/2022   TSH  Recent Labs    02/17/22 1114 02/19/22 0040  TSH 1.591 2.234   Lipid Panel  Lab Results  Component Value Date   CHOL 119 02/19/2022   HDL  29 (L) 02/19/2022   LDLCALC 73 02/19/2022   TRIG 86 02/19/2022   CHOLHDL 4.1 02/19/2022   Drugs of Abuse  No results found for: "LABOPIA", "COCAINSCRNUR", "LABBENZ", "AMPHETMU", "THCU", "LABBARB"    Imaging: No results found.  RADIOLOGY: CT angiogram chest PE protocol 02/17/2022: 1. No evidence of pulmonary embolism.  2. Small bilateral pleural effusions, right greater than left with associated bibasilar atelectasis.  3. Biapical pleural/parenchymal scarring with large subpleural and centrilobular emphysematous bulla.  4. Heart is enlarged. Reflux of contrast into the hepatic veins suggesting right heart failure.  5. No evidence of pneumonia or pulmonary edema.  6. 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 No acute osseous abnormality.  7. Prominent left adrenal gland suggesting adrenal hyperplasia. 8. 8. Biapical pleural/parenchymal scarring with large subpleural and centrilobular emphysematous bulla. Small bilateral pleural effusions, right greater than the left.   DG Chest Port 1 View 02/17/2022 CLINICAL DATA:  Shortness of breath. EXAM: PORTABLE CHEST 1 VIEW COMPARISON:  CXR 02/11/21 FINDINGS: No pleural effusion. No pneumothorax. Compared to prior exam there is interval increase in size of the cardiac contours, which could suggest presence of a pericardial effusion. There are prominent bilateral interstitial opacities, suggestive of either atypical infection or pulmonary venous congestion. No radiographically apparent displaced rib fractures. No focal airspace opacity.  CARDIAC DATABASE: EKG: 02/17/2022: Atypical atrial flutter with variable AV conduction at rate of 64 bpm, right bundle branch block. Low-voltage complexes. Single PVC. Compared to 03/11/2019, atypical atrial flutter is new.   02/21/2022: Sinus rhythm, 96 bpm, first-degree AV block, right bundle branch block, left anterior fascicular block, PVCs.  Echocardiogram: 02/18/2022:  1. Left ventricular ejection fraction, by  estimation, is 25%. The left ventricle has severely decreased function. The left ventricle demonstrates global hypokinesis. The left ventricular internal cavity size was mildly dilated. Left ventricular  diastolic parameters are indeterminate.  2. Right ventricular systolic function is moderately reduced. The right ventricular size is mildly enlarged. There is mildly elevated pulmonary artery systolic pressure. The estimated right ventricular systolic pressure is 44.3 mmHg.  3. Left atrial size was mildly dilated.  4. Right atrial size was moderately dilated.  5. The mitral valve is normal in structure. Mild mitral valve regurgitation. No evidence of mitral stenosis.  6. Tricuspid valve regurgitation is moderate.  7. The aortic valve is tricuspid. There is mild calcification of the aortic valve.  Aortic valve regurgitation is trivial. No aortic stenosis is present.  8. Aortic dilatation noted. There is mild dilatation of the aortic root, measuring 37 mm.  9. The inferior vena cava is dilated in size with <50% respiratory variability, suggesting right atrial pressure of 15 mmHg.  Stress test: None  Heart catheterization: None  Transesophageal echocardiogram with direct-current cardioversion: 02/20/2022: 150 J x 1 synchronized converted to normal sinus  Scheduled Meds:  apixaban  5 mg Oral BID   atorvastatin  20 mg Oral Daily   dapagliflozin propanediol  10 mg Oral Daily   gabapentin  300 mg Oral QHS   magnesium oxide  400 mg Oral BID   metoprolol succinate  25 mg Oral Daily   sacubitril-valsartan  1 tablet Oral BID   sodium chloride flush  3 mL Intravenous Q12H    Continuous Infusions:   PRN Meds: acetaminophen **OR** acetaminophen, ipratropium-albuterol, polyethylene glycol   IMPRESSION & RECOMMENDATIONS: Terry Beltran is a 67 y.o. African-American male whose past medical history and cardiac risk factors include: history of hepatitis C, primary hypertension,  hypercholesterolemia, history of non-Hodgkin's lymphoma status post chemotherapy in 2016 and in remission, admitted to the hospital with worsening dyspnea, leg edema, new onset heart failure and EKG revealing atypical atrial flutter with controlled ventricular response.   Impression: Acute newly discovered systolic/diastolic heart failure, stage C, NYHA class II Cardiomyopathy, etiology unspecific. Paroxysmal atrial flutter Long-term oral anticoagulation Trifascicular block Hypertension Elevated D-dimers-CT PE protocol negative for PE Tobacco use disorder. History of non-Hodgkin's lymphoma status postchemotherapy/now on remission  Recommendations: Newly discovered systolic and diastolic heart failure: Cardiomyopathy : Stage III, NYHA class II. Overall appears euvolemic on physical examination. BNP trending down. Net IO Since Admission: -4,965.38 mL [02/21/22 2011] and discharge weight 119 kg Continue Farxiga, [place], [place]. Hold spironolactone. Will start Lasix for now for volume management. Would like to see close in the next 7 to 14 days.  Labs prior to reevaluate kidney function and electrolytes. Eventually will need ischemic evaluation to rule out CAD given his cardiomyopathy. EKG today shows sinus rhythm with first-degree AV block, RBBB, and left anterior fascicular block.  On telemetry he has intermittent second-degree type I AV block.  Prior EKGs status post cardioversion has noted prolonged QT. Will start magnesium supplementation. Recommend outpatient sleep study.  Paroxysmal atrial flutter: Long-term oral anticoagulation: Underwent transesophageal echocardiogram and direct-current cardioversion 02/20/2022 -150 J Continue low-dose metoprolol succinate for rate control. Amiodarone discontinued due to QT interval Click Here to Calculate/Change CHADS2VASc Score The patient's CHADS2-VASc score is 3, indicating a 3.2% annual risk of stroke.  Therefore, anticoagulation is  recommended.   CHF History: Yes HTN History: Yes Diabetes History: No Stroke History: No Vascular Disease History: No Currently on currently on Eliquis 5 mg p.o. twice daily. Discussed risks, benefits, and alternatives to anticoagulation.  Both patient and wife verbalized understanding  Trifascicular block: Asymptomatic. Patient's ventricular rate increases with ambulation and therefore we will continue low-dose beta-blocker for now. Educated on the being cognizant of her symptoms of near syncope or syncope  Hypertension: Well-controlled on current medical therapy  Plan of care and discharge planning discussed with attending physician.  Patient's questions and concerns were addressed to his satisfaction. He voices understanding of the instructions provided during this encounter.   This note was created using a voice recognition software as a result there may be grammatical errors inadvertently enclosed that do not reflect the nature of this encounter. Every attempt is made to correct such errors.  Mec[facility]  Pager: [phone] Office: [phone] 02/21/2022, 8:11 PM

## 2022-02-22 ENCOUNTER — Other Ambulatory Visit: Payer: Self-pay

## 2022-02-22 DIAGNOSIS — I5023 Acute on chronic systolic (congestive) heart failure: Secondary | ICD-10-CM

## 2022-02-22 DIAGNOSIS — I4892 Unspecified atrial flutter: Secondary | ICD-10-CM

## 2022-02-22 LAB — CULTURE, BLOOD (ROUTINE X 2)
Culture: NO GROWTH
Culture: NO GROWTH
Special Requests: ADEQUATE
Special Requests: ADEQUATE

## 2022-02-22 NOTE — Telephone Encounter (Signed)
Location of hospitalization: [place] Reason for hospitalization: SOB and irregular heart rate Date of discharge: 02/21/2022 Date of first communication with patient: today Person contacting patient: Me Current symptoms: None Do you understand why you were in the Hospital: Yes Questions regarding discharge instructions: None Where were you discharged to: Home Medications reviewed: Yes Allergies reviewed: Yes Dietary changes reviewed: Yes. Discussed low fat and low salt diet.  Referals reviewed: NA Activities of Daily Living: Able to with mild limitations Any transportation issues/concerns: None Any patient concerns: None Confirmed importance & date/time of Follow up appt: Yes Confirmed with patient if condition begins to worsen call. Pt was given the office number and encouraged to call back with questions or concerns: Yes

## 2022-02-22 NOTE — Telephone Encounter (Signed)
Please order and release BMP, NT-proBNP, and Magnesium level to be done 48hr before the office visit.

## 2022-02-23 ENCOUNTER — Encounter (HOSPITAL_COMMUNITY): Payer: Self-pay | Admitting: Cardiology

## 2022-02-27 LAB — ECHO TEE

## 2022-02-28 ENCOUNTER — Other Ambulatory Visit: Payer: Self-pay

## 2022-02-28 ENCOUNTER — Ambulatory Visit: Payer: 59 | Admitting: Cardiology

## 2022-02-28 ENCOUNTER — Encounter: Payer: Self-pay | Admitting: Cardiology

## 2022-02-28 VITALS — BP 112/74 | HR 51 | Resp 19 | Ht 71.0 in | Wt 254.4 lb

## 2022-02-28 DIAGNOSIS — I5022 Chronic systolic (congestive) heart failure: Secondary | ICD-10-CM

## 2022-02-28 DIAGNOSIS — I429 Cardiomyopathy, unspecified: Secondary | ICD-10-CM

## 2022-02-28 DIAGNOSIS — E782 Mixed hyperlipidemia: Secondary | ICD-10-CM

## 2022-02-28 DIAGNOSIS — C8330 Diffuse large B-cell lymphoma, unspecified site: Secondary | ICD-10-CM

## 2022-02-28 DIAGNOSIS — Z79899 Other long term (current) drug therapy: Secondary | ICD-10-CM

## 2022-02-28 DIAGNOSIS — I4892 Unspecified atrial flutter: Secondary | ICD-10-CM

## 2022-02-28 DIAGNOSIS — Z7901 Long term (current) use of anticoagulants: Secondary | ICD-10-CM

## 2022-02-28 DIAGNOSIS — I10 Essential (primary) hypertension: Secondary | ICD-10-CM

## 2022-02-28 MED ORDER — DAPAGLIFLOZIN PROPANEDIOL 10 MG PO TABS: 10.0000 mg | ORAL_TABLET | Freq: Every day | ORAL | 0 refills | Status: DC

## 2022-02-28 MED ORDER — APIXABAN 5 MG PO TABS: 5.0000 mg | ORAL_TABLET | Freq: Two times a day (BID) | ORAL | 0 refills | Status: DC

## 2022-02-28 MED ORDER — METOPROLOL SUCCINATE ER 25 MG PO TB24: 25.0000 mg | ORAL_TABLET | Freq: Every day | ORAL | 0 refills | Status: AC

## 2022-02-28 MED ORDER — MAGNESIUM OXIDE 400 MG PO TABS: 400.0000 mg | ORAL_TABLET | Freq: Two times a day (BID) | ORAL | 0 refills | Status: DC

## 2022-02-28 MED ORDER — FUROSEMIDE 20 MG PO TABS: 20.0000 mg | ORAL_TABLET | Freq: Every day | ORAL | 0 refills | Status: DC

## 2022-02-28 MED ORDER — POTASSIUM CHLORIDE ER 10 MEQ PO TBCR: 10.0000 meq | EXTENDED_RELEASE_TABLET | Freq: Every day | ORAL | 0 refills | Status: DC

## 2022-02-28 MED ORDER — SACUBITRIL-VALSARTAN 24-26 MG PO TABS: 1.0000 | ORAL_TABLET | Freq: Two times a day (BID) | ORAL | 0 refills | Status: DC

## 2022-02-28 MED ORDER — AMIODARONE HCL 200 MG PO TABS: 200.0000 mg | ORAL_TABLET | Freq: Every day | ORAL | 0 refills | Status: DC

## 2022-02-28 NOTE — Progress Notes (Signed)
ID:  Terry Beltran, DOB 18-Feb-1955, MRN [id number]  PCP:  Karie Mainland, FNP  Cardiologist:  Rex Kras, DO, [facility] (established care 02/18/2022)  Date: 02/28/22  Chief Complaint  Patient presents with   Transitions Of Care   Follow-up    1 week    HPI  Terry Beltran is a 67 y.o. African-American male whose past medical history and cardiovascular risk factors include: Cardiomyopathy, HFrEF, atrial flutter with controlled ventricular rate, history of hepatitis C, primary hypertension, hypercholesterolemia, history of non-Hodgkin's lymphoma status post chemotherapy in 2016 and in remission .   He is scheduled to see cardiology on outpatient basis as requested by his PCP.  However, given the progressive shortness of breath, lower extremity swelling, and difficulty laying flat presented to [facility] for further evaluation and management.  He was seen in consultation and was noted to be in atrial flutter with rapid ventricular rate and echocardiogram concerning for new discovered cardiomyopathy.  He was started on guideline directed medical therapy and despite being on parenteral medications he did not convert to sinus rhythm and underwent TEE guided cardioversion in the restore normal sinus rhythm.  Presents today for follow-up.  During the hospitalization he diuresed approximately 5 L net negative urine output.  His discharge weight was 119 kg.  He is currently 115 kg.  He continues to have shortness of breath predominantly with effort related activities.  But no significant lower extremity swelling, PND, orthopnea.  He denies anginal chest pain at rest or with effort related activities.  Patient states that he has been taking his medications as provided at discharge.  ALLERGIES: No Known Allergies  MEDICATION LIST PRIOR TO VISIT: Current Meds  Medication Sig   amiodarone (PACERONE) 200 MG tablet Take 1 tablet (200 mg total) by mouth daily.   atorvastatin (LIPITOR) 20 MG  tablet Take 1 tablet by mouth daily.   famotidine (PEPCID) 20 MG tablet Take 1 tablet (20 mg total) by mouth 2 (two) times daily.   gabapentin (NEURONTIN) 300 MG capsule Take 1 capsule by mouth at bedtime.   potassium chloride (KLOR-CON) 10 MEQ tablet Take 1 tablet (10 mEq total) by mouth daily.   [DISCONTINUED] apixaban (ELIQUIS) 5 MG TABS tablet Take 1 tablet (5 mg total) by mouth 2 (two) times daily.   [DISCONTINUED] dapagliflozin propanediol (FARXIGA) 10 MG TABS tablet Take 1 tablet (10 mg total) by mouth daily.   [DISCONTINUED] furosemide (LASIX) 20 MG tablet Take 1 tablet (20 mg total) by mouth daily.   [DISCONTINUED] magnesium oxide (MAG-OX) 400 MG tablet Take 1 tablet (400 mg total) by mouth 2 (two) times daily for 15 days.   [DISCONTINUED] metoprolol succinate (TOPROL-XL) 25 MG 24 hr tablet Take 1 tablet (25 mg total) by mouth daily.   [DISCONTINUED] sacubitril-valsartan (ENTRESTO) 24-26 MG Take 1 tablet by mouth 2 (two) times daily.     PAST MEDICAL HISTORY: Past Medical History:  Diagnosis Date   Cancer ([place])    Hypertension     PAST SURGICAL HISTORY: Past Surgical History:  Procedure Laterality Date   CARDIOVERSION N/A 02/20/2022   Procedure: CARDIOVERSION;  Surgeon: Rex Kras, DO;  Location: [facility];  Service: Cardiovascular;  Laterality: N/A;   TEE WITHOUT CARDIOVERSION N/A 02/20/2022   Procedure: TRANSESOPHAGEAL ECHOCARDIOGRAM (TEE);  Surgeon: Rex Kras, DO;  Location: [facility];  Service: Cardiovascular;  Laterality: N/A;    FAMILY HISTORY: The patient family history includes Arthritis/Rheumatoid in his mother; Dementia in his sister; Hypertension in his father.  SOCIAL  HISTORY:  The patient  reports that he has been smoking cigarettes. He has never used smokeless tobacco. He reports that he does not currently use alcohol. He reports current drug use. Frequency: 4.00 times per week. Drug: Marijuana.  REVIEW OF SYSTEMS: Review of Systems  Cardiovascular:   Positive for dyspnea on exertion. Negative for chest pain, claudication, irregular heartbeat, leg swelling, near-syncope, orthopnea, palpitations, paroxysmal nocturnal dyspnea and syncope.  Respiratory:  Positive for shortness of breath.   Hematologic/Lymphatic: Negative for bleeding problem.  Musculoskeletal:  Negative for muscle cramps and myalgias.  Neurological:  Negative for dizziness and light-headedness.    PHYSICAL EXAM:    02/28/2022    9:54 AM 02/21/2022    9:44 AM 02/21/2022    8:29 AM  Vitals with BMI  Height 5' 11"$     Weight 254 lbs 6 oz    BMI [id number]    Systolic [id number]  Diastolic 74 98 75  Pulse 51 100 92    Physical Exam  Cardiovascular: Normal rate. An irregularly irregular rhythm present.  Murmur heard. Holosystolic murmur is present with a grade of 3/6 at the lower left sternal border.   RADIOLOGY: CT angiogram chest PE protocol 02/17/2022: 1. No evidence of pulmonary embolism.  2. Small bilateral pleural effusions, right greater than left with associated bibasilar atelectasis.  3. Biapical pleural/parenchymal scarring with large subpleural and centrilobular emphysematous bulla.  4. Heart is enlarged. Reflux of contrast into the hepatic veins suggesting right heart failure.  5. No evidence of pneumonia or pulmonary edema.  6. 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 No acute osseous abnormality.  7. Prominent left adrenal gland suggesting adrenal hyperplasia. 8. 8. Biapical pleural/parenchymal scarring with large subpleural and centrilobular emphysematous bulla. Small bilateral pleural effusions, right greater than the left.   DG Chest Port 1 View 02/17/2022 CLINICAL DATA:  Shortness of breath. EXAM: PORTABLE CHEST 1 VIEW COMPARISON:  CXR 02/11/21 FINDINGS: No pleural effusion. No pneumothorax. Compared to prior exam there is interval increase in size of the cardiac contours, which could suggest presence of a pericardial effusion. There are prominent bilateral  interstitial opacities, suggestive of either atypical infection or pulmonary venous congestion. No radiographically apparent displaced rib fractures. No focal airspace opacity.  CARDIAC DATABASE: Transesophageal echocardiogram with direct-current cardioversion: 02/20/2022: 150 J x 1 synchronized converted to normal sinus  EKG: 02/28/2022: Atrial flutter, 58 bpm, right bundle branch block, left axis, left anterior fascicular block TWI in the lateral leads consider ischemia.  Echocardiogram: 02/18/2022 1. Left ventricular ejection fraction, by estimation, is 25%. The left  ventricle has severely decreased function. The left ventricle demonstrates  global hypokinesis. The left ventricular internal cavity size was mildly  dilated. Left ventricular  diastolic parameters are indeterminate.  2. Right ventricular systolic function is moderately reduced. The right  ventricular size is mildly enlarged. There is mildly elevated pulmonary  artery systolic pressure. The estimated right ventricular systolic  pressure is [id number] mmHg.  3. Left atrial size was mildly dilated.  4. Right atrial size was moderately dilated.  5. The mitral valve is normal in structure. Mild mitral valve  regurgitation. No evidence of mitral stenosis.  6. Tricuspid valve regurgitation is moderate.  7. The aortic valve is tricuspid. There is mild calcification of the  aortic valve. Aortic valve regurgitation is trivial. No aortic stenosis is  present.  8. Aortic dilatation noted. There is mild dilatation of the aortic root,  measuring 37 mm.  9. The inferior vena cava  is dilated in size with <50% respiratory  variability, suggesting right atrial pressure of 15 mmHg.     Stress Testing: No results found for this or any previous visit from the past 1095 days.   Heart Catheterization: None  LABORATORY DATA:    Latest Ref Rng & Units 02/20/2022   12:55 AM 02/18/2022   12:28 AM 02/17/2022   11:13 AM  CBC  WBC 4.0 - 10.5  K/uL 9.3  9.7  8.7   Hemoglobin 13.0 - 17.0 g/dL 13.8  12.4  12.2   Hematocrit 39.0 - 52.0 % 41.5  39.6  37.7   Platelets 150 - 400 K/uL 261  243  248        Latest Ref Rng & Units 02/21/2022   12:43 AM 02/20/2022   12:55 AM 02/19/2022   12:40 AM  CMP  Glucose 70 - 99 mg/dL 112  110  101   BUN 8 - 23 mg/dL 17  19  15   $ Creatinine 0.61 - 1.24 mg/dL 1.46  1.61  1.35   Sodium 135 - 145 mmol/L 135  137  138   Potassium 3.5 - 5.1 mmol/L 3.8  4.1  3.4   Chloride 98 - 111 mmol/L 99  101  100   CO2 22 - 32 mmol/L 28  27  26   $ Calcium 8.9 - 10.3 mg/dL 8.7  9.1  9.2     Lipid Panel     Component Value Date/Time   CHOL 119 02/19/2022 0040   TRIG 86 02/19/2022 0040   HDL 29 (L) 02/19/2022 0040   CHOLHDL 4.1 02/19/2022 0040   VLDL 17 02/19/2022 0040   LDLCALC 73 02/19/2022 0040    No components found for: "NTPROBNP" No results for input(s): "PROBNP" in the last 8760 hours. Recent Labs    02/17/22 1114 02/19/22 0040  TSH 1.591 2.234    BMP Recent Labs    02/19/22 0040 02/20/22 0055 02/21/22 0043  NA 138 137 135  K 3.4* 4.1 3.8  CL 100 101 99  CO2 26 27 28  $ GLUCOSE 101* 110* 112*  BUN 15 19 17  $ CREATININE 1.35* 1.61* 1.46*  CALCIUM 9.2 9.1 8.7*  GFRNONAA 57* 46* 52*    HEMOGLOBIN A1C Lab Results  Component Value Date   HGBA1C 5.8 (H) 02/19/2022   MPG 119.76 02/19/2022    IMPRESSION:    ICD-10-CM   1. Chronic HFrEF (heart failure with reduced ejection fraction) (HCC)  I50.22 EKG 12-Lead    Ambulatory referral to Sleep Studies    magnesium oxide (MAG-OX) 400 MG tablet    dapagliflozin propanediol (FARXIGA) 10 MG TABS tablet    furosemide (LASIX) 20 MG tablet    metoprolol succinate (TOPROL-XL) 25 MG 24 hr tablet    sacubitril-valsartan (ENTRESTO) 24-26 MG    potassium chloride (KLOR-CON) 10 MEQ tablet    2. Cardiomyopathy, unspecified type ([place])  I42.9     3. Atrial flutter with controlled response (HCC)  I48.92 amiodarone (PACERONE) 200 MG tablet    apixaban  (ELIQUIS) 5 MG TABS tablet    4. Current use of long term anticoagulation  Z79.01     5. Long term current use of antiarrhythmic drug  Z79.899     6. Benign hypertension  I10     7. Mixed hyperlipidemia  E78.2     8. Diffuse large B-cell lymphoma, unspecified body region [facility])  C83.30        RECOMMENDATIONS: Terry Beltran is a 67  y.o. African-American male whose past medical history and cardiac risk factors include: Cardiomyopathy, HFrEF, atrial flutter with controlled ventricular rate, history of hepatitis C, primary hypertension, hypercholesterolemia, history of non-Hodgkin's lymphoma status post chemotherapy in 2016 and in remission .   Chronic HFrEF (heart failure with reduced ejection fraction) (HCC) Cardiomyopathy, unspecified type ([place]) Differential diagnosis for newly discovered cardiomyopathy includes but not limited to: Tachycardia induced, history of chemotherapy, questionable ischemic substrate. Stage C, NYHA class II/III Discharge weight 119 kg from February 2024 Office weight today 115 kg Refilled heart failure medications as noted above. Reemphasized importance of strict I's and O's, daily weights, reduce salt consumption. To rule out underlying ischemic substrate we discussed undergoing either stress test versus right and left heart catheterization.  We discussed the risks, benefits, alternatives, limitations of each modality.  He will discuss it further with his wife prior to making his decision.  Atrial flutter with controlled response ([place]) Current use of long term anticoagulation Long term current use of antiarrhythmic drug EKG today shows atrial flutter with controlled ventricular rate Rate control: Metoprolol. Rhythm control: Start amiodarone. Thromboembolic prophylaxis: Eliquis Underwent TEE guided cardioversion 02/20/2022 150 J x 1 CHA2DS2-VASc SCORE is 3 which correlates to 3.2% risk of stroke per year (CHF, hypertension, age).  Risks, benefits, and  alternatives of direct current cardioversion reviewed with the and patient. Risk includes but not limited to: potential for post-cardioversion rhythms, life-threatening arrhythmias (ventricular tachycardia and fibrillation, profound bradycardia, cardiac arrest), myocardial damage, acute pulmonary edema, skin burns, transient hypotension. Benefits include restoration of sinus rhythm. Alternatives to treatment were discussed, questions were answered, patient voices understanding and provides verbal feedback.  Patient is willing to proceed. Will schedule it on or after 03/20/2022.  Refills for Eliquis provided for 2 additional weeks as well. Patient understands that if there is an interruption in Eliquis he will need TEE guided cardioversion as opposed to direct-current cardioversion only.  He verbalizes understanding of the recommendations.  Benign hypertension Office blood pressures are well-controlled. Medication changes as noted above and refills provided.  Mixed hyperlipidemia Currently on atorvastatin.   He denies myalgia or other side effects. Most recent lipids dated February 2024 reviewed as noted above. Currently managed by primary care provider.  FINAL MEDICATION LIST END OF ENCOUNTER: Meds ordered this encounter  Medications   amiodarone (PACERONE) 200 MG tablet    Sig: Take 1 tablet (200 mg total) by mouth daily.    Dispense:  90 tablet    Refill:  0   magnesium oxide (MAG-OX) 400 MG tablet    Sig: Take 1 tablet (400 mg total) by mouth 2 (two) times daily for 15 days.    Dispense:  30 tablet    Refill:  0   apixaban (ELIQUIS) 5 MG TABS tablet    Sig: Take 1 tablet (5 mg total) by mouth 2 (two) times daily.    Dispense:  60 tablet    Refill:  0   dapagliflozin propanediol (FARXIGA) 10 MG TABS tablet    Sig: Take 1 tablet (10 mg total) by mouth daily.    Dispense:  30 tablet    Refill:  0   furosemide (LASIX) 20 MG tablet    Sig: Take 1 tablet (20 mg total) by mouth daily.     Dispense:  30 tablet    Refill:  0   metoprolol succinate (TOPROL-XL) 25 MG 24 hr tablet    Sig: Take 1 tablet (25 mg total) by mouth daily.  Dispense:  30 tablet    Refill:  0   sacubitril-valsartan (ENTRESTO) 24-26 MG    Sig: Take 1 tablet by mouth 2 (two) times daily.    Dispense:  60 tablet    Refill:  0   potassium chloride (KLOR-CON) 10 MEQ tablet    Sig: Take 1 tablet (10 mEq total) by mouth daily.    Dispense:  90 tablet    Refill:  0    Medications Discontinued During This Encounter  Medication Reason   pantoprazole (PROTONIX) 20 MG tablet    apixaban (ELIQUIS) 5 MG TABS tablet Reorder   dapagliflozin propanediol (FARXIGA) 10 MG TABS tablet Reorder   sacubitril-valsartan (ENTRESTO) 24-26 MG Reorder   metoprolol succinate (TOPROL-XL) 25 MG 24 hr tablet Reorder   furosemide (LASIX) 20 MG tablet Reorder   magnesium oxide (MAG-OX) 400 MG tablet Reorder     Current Outpatient Medications:    amiodarone (PACERONE) 200 MG tablet, Take 1 tablet (200 mg total) by mouth daily., Disp: 90 tablet, Rfl: 0   atorvastatin (LIPITOR) 20 MG tablet, Take 1 tablet by mouth daily., Disp: , Rfl:    famotidine (PEPCID) 20 MG tablet, Take 1 tablet (20 mg total) by mouth 2 (two) times daily., Disp: 60 tablet, Rfl: 0   gabapentin (NEURONTIN) 300 MG capsule, Take 1 capsule by mouth at bedtime., Disp: , Rfl:    potassium chloride (KLOR-CON) 10 MEQ tablet, Take 1 tablet (10 mEq total) by mouth daily., Disp: 90 tablet, Rfl: 0   apixaban (ELIQUIS) 5 MG TABS tablet, Take 1 tablet (5 mg total) by mouth 2 (two) times daily., Disp: 60 tablet, Rfl: 0   dapagliflozin propanediol (FARXIGA) 10 MG TABS tablet, Take 1 tablet (10 mg total) by mouth daily., Disp: 30 tablet, Rfl: 0   furosemide (LASIX) 20 MG tablet, Take 1 tablet (20 mg total) by mouth daily., Disp: 30 tablet, Rfl: 0   magnesium oxide (MAG-OX) 400 MG tablet, Take 1 tablet (400 mg total) by mouth 2 (two) times daily for 15 days., Disp: 30 tablet,  Rfl: 0   metoprolol succinate (TOPROL-XL) 25 MG 24 hr tablet, Take 1 tablet (25 mg total) by mouth daily., Disp: 30 tablet, Rfl: 0   sacubitril-valsartan (ENTRESTO) 24-26 MG, Take 1 tablet by mouth 2 (two) times daily., Disp: 60 tablet, Rfl: 0  Orders Placed This Encounter  Procedures   Ambulatory referral to Sleep Studies   EKG 12-Lead    There are no Patient Instructions on file for this visit.   --Continue cardiac medications as reconciled in final medication list. --Return in about 4 weeks (around 03/28/2022) for Follow up, Atrial flutter, heart failure management.. or sooner if needed. --Continue follow-up with your primary care physician regarding the management of your other chronic comorbid conditions.  Patient's questions and concerns were addressed to his satisfaction. He voices understanding of the instructions provided during this encounter.   This note was created using a voice recognition software as a result there may be grammatical errors inadvertently enclosed that do not reflect the nature of this encounter. Every attempt is made to correct such errors.  Rex Kras, [place], [facility]  Pager: [phone] Office: [phone]

## 2022-03-15 ENCOUNTER — Other Ambulatory Visit (HOSPITAL_COMMUNITY): Payer: Self-pay

## 2022-03-16 LAB — BMP8+EGFR
BUN/Creatinine Ratio: 16 (ref 10–24)
BUN: 26 mg/dL (ref 8–27)
CO2: 21 mmol/L (ref 20–29)
Calcium: 10.6 mg/dL — ABNORMAL HIGH (ref 8.6–10.2)
Chloride: 99 mmol/L (ref 96–106)
Creatinine, Ser: 1.66 mg/dL — ABNORMAL HIGH (ref 0.76–1.27)
Glucose: 94 mg/dL (ref 70–99)
Potassium: 4.9 mmol/L (ref 3.5–5.2)
Sodium: 139 mmol/L (ref 134–144)
eGFR: 45 mL/min/{1.73_m2} — ABNORMAL LOW (ref >59)

## 2022-03-16 LAB — PRO B NATRIURETIC PEPTIDE: NT-Pro BNP: 541 pg/mL — ABNORMAL HIGH (ref 0–376)

## 2022-03-16 LAB — MAGNESIUM: Magnesium: 2.5 mg/dL — ABNORMAL HIGH (ref 1.6–2.3)

## 2022-03-20 ENCOUNTER — Encounter (HOSPITAL_COMMUNITY): Payer: Self-pay | Admitting: Certified Registered Nurse Anesthetist

## 2022-03-20 ENCOUNTER — Ambulatory Visit (HOSPITAL_COMMUNITY)
Admission: RE | Admit: 2022-03-20 | Discharge: 2022-03-20 | Disposition: A | Discharge status: Home / Self Care | Payer: 59 | Attending: Cardiology | Admitting: Cardiology

## 2022-03-20 ENCOUNTER — Encounter (HOSPITAL_COMMUNITY)
Admission: RE | Disposition: A | Discharge status: Home / Self Care | Payer: Self-pay | Source: Home / Self Care | Attending: Cardiology

## 2022-03-20 ENCOUNTER — Encounter (HOSPITAL_COMMUNITY): Payer: Self-pay | Admitting: Cardiology

## 2022-03-20 DIAGNOSIS — Z538 Procedure and treatment not carried out for other reasons: Secondary | ICD-10-CM | POA: Insufficient documentation

## 2022-03-20 DIAGNOSIS — I4892 Unspecified atrial flutter: Secondary | ICD-10-CM | POA: Insufficient documentation

## 2022-03-20 SURGERY — CANCELLED PROCEDURE

## 2022-03-20 NOTE — Anesthesia Preprocedure Evaluation (Deleted)
Anesthesia Evaluation    Reviewed: Allergy & Precautions, Patient's Chart, lab work & pertinent test results  Airway        Dental   Pulmonary Current Smoker          Cardiovascular hypertension, Pt. on home beta blockers and Pt. on medications +CHF  + dysrhythmias Atrial Fibrillation      Neuro/Psych negative neurological ROS  negative psych ROS   GI/Hepatic negative GI ROS, Neg liver ROS,,,  Endo/Other  negative endocrine ROS    Renal/GU Renal disease     Musculoskeletal negative musculoskeletal ROS (+)    Abdominal   Peds  Hematology negative hematology ROS (+)   Anesthesia Other Findings   Reproductive/Obstetrics                             Anesthesia Physical Anesthesia Plan  ASA:   Anesthesia Plan:    Post-op Pain Management:    Induction:   PONV Risk Score and Plan:   Airway Management Planned:   Additional Equipment:   Intra-op Plan:   Post-operative Plan:   Informed Consent:   Plan Discussed with:   Anesthesia Plan Comments: (NSR during check in. )       Anesthesia Quick Evaluation

## 2022-03-20 NOTE — H&P (Signed)
Patient was scheduled for direct-current cardioversion given his underlying atrial flutter.  Precardioversion EKG confirms normal sinus rhythm and the procedure will be canceled.  Plan of care discussed with the patient.  Continue current medications with the exception of using Lasix on as needed basis for weights greater than 254 pounds.   Mechele Claude [facility] [place]  Pager:  [phone] Office: [phone]  03/20/22 at 11:01 AM

## 2022-03-20 NOTE — Progress Notes (Signed)
Patient in sinus rhythm, confirmed by ekg, case canceled

## 2022-03-23 ENCOUNTER — Other Ambulatory Visit: Payer: Self-pay | Admitting: Cardiology

## 2022-03-23 DIAGNOSIS — I5022 Chronic systolic (congestive) heart failure: Secondary | ICD-10-CM

## 2022-03-28 ENCOUNTER — Encounter: Payer: Self-pay | Admitting: Cardiology

## 2022-03-28 ENCOUNTER — Ambulatory Visit: Payer: 59 | Admitting: Cardiology

## 2022-03-28 VITALS — BP 117/80 | HR 88 | Resp 16 | Ht 71.0 in | Wt 248.0 lb

## 2022-03-28 DIAGNOSIS — I429 Cardiomyopathy, unspecified: Secondary | ICD-10-CM

## 2022-03-28 DIAGNOSIS — Z79899 Other long term (current) drug therapy: Secondary | ICD-10-CM

## 2022-03-28 DIAGNOSIS — Z7901 Long term (current) use of anticoagulants: Secondary | ICD-10-CM

## 2022-03-28 DIAGNOSIS — E782 Mixed hyperlipidemia: Secondary | ICD-10-CM

## 2022-03-28 DIAGNOSIS — I10 Essential (primary) hypertension: Secondary | ICD-10-CM

## 2022-03-28 DIAGNOSIS — I5022 Chronic systolic (congestive) heart failure: Secondary | ICD-10-CM

## 2022-03-28 DIAGNOSIS — I4892 Unspecified atrial flutter: Secondary | ICD-10-CM

## 2022-03-28 DIAGNOSIS — C8330 Diffuse large B-cell lymphoma, unspecified site: Secondary | ICD-10-CM

## 2022-03-28 MED ORDER — SACUBITRIL-VALSARTAN 24-26 MG PO TABS: 1.0000 | ORAL_TABLET | Freq: Two times a day (BID) | ORAL | 0 refills | Status: DC

## 2022-03-28 MED ORDER — DAPAGLIFLOZIN PROPANEDIOL 10 MG PO TABS: 10.0000 mg | ORAL_TABLET | Freq: Every day | ORAL | 0 refills | Status: DC

## 2022-03-28 NOTE — Progress Notes (Signed)
ID:  Terry Beltran, DOB 05/26/1955, MRN [id number]  PCP:  Morey Hummingbird, FNP  Cardiologist:  Rex Kras, DO, [facility] (established care 02/18/2022)  Date: 03/28/22 Last Office Visit: 02/28/2022  Chief Complaint  Patient presents with   Congestive Heart Failure   Follow-up    4 week    HPI  Terry Beltran is a 67 y.o. African-American male whose past medical history and cardiovascular risk factors include: Cardiomyopathy, HFrEF, atrial flutter with controlled ventricular rate, history of hepatitis C, primary hypertension, hypercholesterolemia, history of non-Hodgkin's lymphoma status post chemotherapy in 2016 and in remission .   February 2024 patient was hospitalized for shortness of breath, lower extremity swelling and was diagnosed with HFrEF.  He also was in atrial flutter with rapid ventricular rate.  During hospitalization he diuresed at least 5 L.  And his discharge weight was approximately 265 pounds.  At the last office visit he was back in atrial flutter and therefore the shared decision was to proceed with direct-current cardioversion as he has been on anticoagulation uninterrupted for 4 weeks.  At the time of direct-current cardioversion he was noted to be in sinus and therefore the procedure was canceled.  He presents today for follow-up.  He denies anginal chest pain or heart failure symptoms.  He is able to remain in net negative fluid balance on current medical therapy.  He uses Lasix on as needed basis.  Patient requesting refills on Entresto and Farxiga.  Patient states that his wife does complain of him snoring at night and given the new diagnosis of heart failure and atrial flutter we discussed considering sleep study to help mitigate additional risk factors.  ALLERGIES: No Known Allergies  MEDICATION LIST PRIOR TO VISIT: Current Meds  Medication Sig   acetaminophen (TYLENOL) 650 MG CR tablet Take 650-1,300 mg by mouth every 8 (eight) hours as needed  for pain.   amiodarone (PACERONE) 200 MG tablet Take 1 tablet (200 mg total) by mouth daily.   apixaban (ELIQUIS) 5 MG TABS tablet Take 1 tablet (5 mg total) by mouth 2 (two) times daily.   furosemide (LASIX) 20 MG tablet Take 1 tablet (20 mg total) by mouth daily.   gabapentin (NEURONTIN) 300 MG capsule Take 300 mg by mouth at bedtime as needed (pain).   magnesium oxide (MAG-OX) 400 MG tablet TAKE 1 TABLET(400 MG) BY MOUTH TWICE DAILY FOR 15 DAYS   meloxicam (MOBIC) 15 MG tablet Take 15 mg by mouth as needed.   metoprolol succinate (TOPROL-XL) 25 MG 24 hr tablet Take 1 tablet (25 mg total) by mouth daily.   Multiple Vitamins-Minerals (MULTIVITAMIN WITH MINERALS) tablet Take 1 tablet by mouth daily.   potassium chloride (KLOR-CON) 10 MEQ tablet Take 1 tablet (10 mEq total) by mouth daily.   [DISCONTINUED] dapagliflozin propanediol (FARXIGA) 10 MG TABS tablet Take 1 tablet (10 mg total) by mouth daily.   [DISCONTINUED] sacubitril-valsartan (ENTRESTO) 24-26 MG Take 1 tablet by mouth 2 (two) times daily.     PAST MEDICAL HISTORY: Past Medical History:  Diagnosis Date   Cancer ([place])    Hypertension     PAST SURGICAL HISTORY: Past Surgical History:  Procedure Laterality Date   CARDIOVERSION N/A 02/20/2022   Procedure: CARDIOVERSION;  Surgeon: Rex Kras, DO;  Location: [facility];  Service: Cardiovascular;  Laterality: N/A;   TEE WITHOUT CARDIOVERSION N/A 02/20/2022   Procedure: TRANSESOPHAGEAL ECHOCARDIOGRAM (TEE);  Surgeon: Rex Kras, DO;  Location: [facility];  Service: Cardiovascular;  Laterality: N/A;  FAMILY HISTORY: The patient family history includes Arthritis/Rheumatoid in his mother; Dementia in his sister; Hypertension in his father.  SOCIAL HISTORY:  The patient  reports that he quit smoking 13 days ago. His smoking use included cigarettes. He has a 10.00 pack-year smoking history. He has never used smokeless tobacco. He reports that he does not currently use alcohol.  He reports current drug use. Frequency: 4.00 times per week. Drug: Marijuana.  REVIEW OF SYSTEMS: Review of Systems  Cardiovascular:  Positive for dyspnea on exertion (improved). Negative for chest pain, claudication, irregular heartbeat, leg swelling, near-syncope, orthopnea, palpitations, paroxysmal nocturnal dyspnea and syncope.  Respiratory:  Positive for shortness of breath (improved) and snoring.   Hematologic/Lymphatic: Negative for bleeding problem.  Musculoskeletal:  Negative for muscle cramps and myalgias.  Neurological:  Negative for dizziness and light-headedness.    PHYSICAL EXAM:    03/28/2022    9:21 AM 02/28/2022    9:54 AM 02/21/2022    9:44 AM  Vitals with BMI  Height '5\' 11"'$  '5\' 11"'$    Weight 248 lbs 254 lbs 6 oz   BMI Q000111Q Q000111Q   Systolic [id number]  Diastolic 80 74 98  Pulse 88 51 100    Physical Exam  Constitutional: No distress.  Age appropriate, hemodynamically stable.   Neck: No JVD present.  Cardiovascular: Normal rate, regular rhythm, S1 normal, S2 normal, intact distal pulses and normal pulses. Exam reveals no gallop, no S3 and no S4.  Murmur heard. Holosystolic murmur is present with a grade of 3/6 at the lower left sternal border. Pulmonary/Chest: Effort normal and breath sounds normal. No stridor. He has no wheezes. He has no rales.  Abdominal: Soft. Bowel sounds are normal. He exhibits no distension. There is no abdominal tenderness.  Musculoskeletal:        General: No edema.     Cervical back: Neck supple.  Neurological: He is alert and oriented to person, place, and time. He has intact cranial nerves (2-12).  Skin: Skin is warm and moist.    RADIOLOGY: CT angiogram chest PE protocol 02/17/2022: 1. No evidence of pulmonary embolism.  2. Small bilateral pleural effusions, right greater than left with associated bibasilar atelectasis.  3. Biapical pleural/parenchymal scarring with large subpleural and centrilobular emphysematous bulla.  4. Heart  is enlarged. Reflux of contrast into the hepatic veins suggesting right heart failure.  5. No evidence of pneumonia or pulmonary edema.  6. 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 No acute osseous abnormality.  7. Prominent left adrenal gland suggesting adrenal hyperplasia. 8. 8. Biapical pleural/parenchymal scarring with large subpleural and centrilobular emphysematous bulla. Small bilateral pleural effusions, right greater than the left.   DG Chest Port 1 View 02/17/2022 CLINICAL DATA:  Shortness of breath. EXAM: PORTABLE CHEST 1 VIEW COMPARISON:  CXR 02/11/21 FINDINGS: No pleural effusion. No pneumothorax. Compared to prior exam there is interval increase in size of the cardiac contours, which could suggest presence of a pericardial effusion. There are prominent bilateral interstitial opacities, suggestive of either atypical infection or pulmonary venous congestion. No radiographically apparent displaced rib fractures. No focal airspace opacity.  CARDIAC DATABASE: Transesophageal echocardiogram with direct-current cardioversion: 02/20/2022: 150 J x 1 synchronized converted to normal sinus  EKG: 02/28/2022: Atrial flutter, 58 bpm, right bundle branch block, left axis, left anterior fascicular block TWI in the lateral leads consider ischemia.  Echocardiogram: 02/18/2022 1. Left ventricular ejection fraction, by estimation, is 25%. The left  ventricle has severely decreased function. The left  ventricle demonstrates  global hypokinesis. The left ventricular internal cavity size was mildly  dilated. Left ventricular  diastolic parameters are indeterminate.  2. Right ventricular systolic function is moderately reduced. The right  ventricular size is mildly enlarged. There is mildly elevated pulmonary  artery systolic pressure. The estimated right ventricular systolic  pressure is Q000111Q mmHg.  3. Left atrial size was mildly dilated.  4. Right atrial size was moderately dilated.  5. The mitral  valve is normal in structure. Mild mitral valve  regurgitation. No evidence of mitral stenosis.  6. Tricuspid valve regurgitation is moderate.  7. The aortic valve is tricuspid. There is mild calcification of the  aortic valve. Aortic valve regurgitation is trivial. No aortic stenosis is  present.  8. Aortic dilatation noted. There is mild dilatation of the aortic root,  measuring 37 mm.  9. The inferior vena cava is dilated in size with <50% respiratory  variability, suggesting right atrial pressure of 15 mmHg.     Stress Testing: No results found for this or any previous visit from the past 1095 days.   Heart Catheterization: None  LABORATORY DATA:    Latest Ref Rng & Units 02/20/2022   12:55 AM 02/18/2022   12:28 AM 02/17/2022   11:13 AM  CBC  WBC 4.0 - 10.5 K/uL 9.3  9.7  8.7   Hemoglobin 13.0 - 17.0 g/dL 13.8  12.4  12.2   Hematocrit 39.0 - 52.0 % 41.5  39.6  37.7   Platelets 150 - 400 K/uL 261  243  248        Latest Ref Rng & Units 03/15/2022    9:52 AM 02/21/2022   12:43 AM 02/20/2022   12:55 AM  CMP  Glucose 70 - 99 mg/dL 94  112  110   BUN 8 - 27 mg/dL '26  17  19   '$ Creatinine 0.76 - 1.27 mg/dL 1.66  1.46  1.61   Sodium 134 - 144 mmol/L 139  135  137   Potassium 3.5 - 5.2 mmol/L 4.9  3.8  4.1   Chloride 96 - 106 mmol/L 99  99  101   CO2 20 - 29 mmol/L '21  28  27   '$ Calcium 8.6 - 10.2 mg/dL 10.6  8.7  9.1     Lipid Panel     Component Value Date/Time   CHOL 119 02/19/2022 0040   TRIG 86 02/19/2022 0040   HDL 29 (L) 02/19/2022 0040   CHOLHDL 4.1 02/19/2022 0040   VLDL 17 02/19/2022 0040   LDLCALC 73 02/19/2022 0040    No components found for: "NTPROBNP" Recent Labs    03/15/22 0952  PROBNP 541*   Recent Labs    02/17/22 1114 02/19/22 0040  TSH 1.591 2.234    BMP Recent Labs    02/19/22 0040 02/20/22 0055 02/21/22 0043 03/15/22 0952  NA 138 137 135 139  K 3.4* 4.1 3.8 4.9  CL 100 101 99 99  CO2 '26 27 28 21  '$ GLUCOSE 101* 110* 112* 94  BUN '15  19 17 26  '$ CREATININE 1.35* 1.61* 1.46* 1.66*  CALCIUM 9.2 9.1 8.7* 10.6*  GFRNONAA 57* 46* 52*  --     HEMOGLOBIN A1C Lab Results  Component Value Date   HGBA1C 5.8 (H) 02/19/2022   MPG 119.76 02/19/2022    IMPRESSION:    ICD-10-CM   1. Chronic HFrEF (heart failure with reduced ejection fraction) (HCC)  I50.22 Ambulatory referral to Cardiac Electrophysiology  PCV MYOCARDIAL PERFUSION WITH LEXISCAN    Ambulatory referral to Sleep Studies    CMP14+EGFR    Hemoglobin and hematocrit, blood    Pro b natriuretic peptide (BNP)    sacubitril-valsartan (ENTRESTO) 24-26 MG    dapagliflozin propanediol (FARXIGA) 10 MG TABS tablet    PCV ECHOCARDIOGRAM COMPLETE    2. Cardiomyopathy, unspecified type ([place])  I42.9 Ambulatory referral to Cardiac Electrophysiology    PCV MYOCARDIAL PERFUSION WITH LEXISCAN    Ambulatory referral to Sleep Studies    3. Paroxysmal atrial flutter (HCC)  I48.92 Ambulatory referral to Sleep Studies    4. Current use of long term anticoagulation  Z79.01     5. Long term current use of antiarrhythmic drug  Z79.899     6. Benign hypertension  I10     7. Mixed hyperlipidemia  E78.2     8. Diffuse large B-cell lymphoma, unspecified body region [facility])  C83.30        RECOMMENDATIONS: Imraan Stich is a 67 y.o. African-American male whose past medical history and cardiac risk factors include: Cardiomyopathy, HFrEF, atrial flutter with controlled ventricular rate, history of hepatitis C, primary hypertension, hypercholesterolemia, history of non-Hodgkin's lymphoma status post chemotherapy in 2016 and in remission .   Chronic HFrEF (heart failure with reduced ejection fraction) (HCC) Cardiomyopathy, unspecified type ([place]) Differential diagnosis for newly discovered cardiomyopathy includes but not limited to: Tachycardia induced, history of chemotherapy, questionable ischemic substrate. Stage C, NYHA class II Discharge weight 265 pounds from February  2024 Office weight today 248 pounds Recent labs from 03/15/2022 independently reviewed. Reemphasized importance of strict I's and O's, daily weights, reduce salt consumption. Medications reconciled. In the past he has been reluctant to proceed with right and left heart catheterization; however, he is agreeable with proceeding with stress test to evaluate for reversible ischemia. Will place a referral for sleep medicine to evaluate for sleep apnea given his comorbidities and frequent snoring. Will check labs prior to next office visit  Atrial flutter with controlled response ([place]) Current use of long term anticoagulation Long term current use of antiarrhythmic drug Rate control: Metoprolol. Rhythm control: Start amiodarone. Thromboembolic prophylaxis: Eliquis Underwent TEE guided cardioversion 02/20/2022 150 J x 1 CHA2DS2-VASc SCORE is 3 which correlates to 3.2% risk of stroke per year (CHF, hypertension, age).  Ideally would like to avoid long-term amiodarone given the side effect profile. Considered Multaq but given his underlying conduction disease feel reluctant. Patient is agreeable to proceed with EP consultation for catheter directed atrial flutter ablation.  Benign hypertension Office blood pressures are well-controlled. Medication changes as noted above and refills provided.  Mixed hyperlipidemia Currently on atorvastatin.   He denies myalgia or other side effects. Most recent lipids dated February 2024 reviewed as noted above. Currently managed by primary care provider.  Prior to the next office visit we will proceed with echocardiogram and stress test.  Patient understands clearly that given his comorbid conditions he should have a cardiologist; however, if commuting from [facility] to [place] becomes rate limiting he is advised to find cardiology closer to home.  FINAL MEDICATION LIST END OF ENCOUNTER: Meds ordered this encounter  Medications   sacubitril-valsartan  (ENTRESTO) 24-26 MG    Sig: Take 1 tablet by mouth 2 (two) times daily.    Dispense:  60 tablet    Refill:  0   dapagliflozin propanediol (FARXIGA) 10 MG TABS tablet    Sig: Take 1 tablet (10 mg total) by mouth daily.    Dispense:  30 tablet    Refill:  0    Medications Discontinued During This Encounter  Medication Reason   dapagliflozin propanediol (FARXIGA) 10 MG TABS tablet Reorder   sacubitril-valsartan (ENTRESTO) 24-26 MG Reorder     Current Outpatient Medications:    acetaminophen (TYLENOL) 650 MG CR tablet, Take 650-1,300 mg by mouth every 8 (eight) hours as needed for pain., Disp: , Rfl:    amiodarone (PACERONE) 200 MG tablet, Take 1 tablet (200 mg total) by mouth daily., Disp: 90 tablet, Rfl: 0   apixaban (ELIQUIS) 5 MG TABS tablet, Take 1 tablet (5 mg total) by mouth 2 (two) times daily., Disp: 60 tablet, Rfl: 0   furosemide (LASIX) 20 MG tablet, Take 1 tablet (20 mg total) by mouth daily., Disp: 30 tablet, Rfl: 0   gabapentin (NEURONTIN) 300 MG capsule, Take 300 mg by mouth at bedtime as needed (pain)., Disp: , Rfl:    magnesium oxide (MAG-OX) 400 MG tablet, TAKE 1 TABLET(400 MG) BY MOUTH TWICE DAILY FOR 15 DAYS, Disp: 30 tablet, Rfl: 0   meloxicam (MOBIC) 15 MG tablet, Take 15 mg by mouth as needed., Disp: , Rfl:    metoprolol succinate (TOPROL-XL) 25 MG 24 hr tablet, Take 1 tablet (25 mg total) by mouth daily., Disp: 30 tablet, Rfl: 0   Multiple Vitamins-Minerals (MULTIVITAMIN WITH MINERALS) tablet, Take 1 tablet by mouth daily., Disp: , Rfl:    potassium chloride (KLOR-CON) 10 MEQ tablet, Take 1 tablet (10 mEq total) by mouth daily., Disp: 90 tablet, Rfl: 0   dapagliflozin propanediol (FARXIGA) 10 MG TABS tablet, Take 1 tablet (10 mg total) by mouth daily., Disp: 30 tablet, Rfl: 0   sacubitril-valsartan (ENTRESTO) 24-26 MG, Take 1 tablet by mouth 2 (two) times daily., Disp: 60 tablet, Rfl: 0  Orders Placed This Encounter  Procedures   CMP14+EGFR   Hemoglobin and  hematocrit, blood   Pro b natriuretic peptide (BNP)   Ambulatory referral to Cardiac Electrophysiology   Ambulatory referral to Sleep Studies   PCV MYOCARDIAL PERFUSION WITH LEXISCAN   PCV ECHOCARDIOGRAM COMPLETE    There are no Patient Instructions on file for this visit.   --Continue cardiac medications as reconciled in final medication list. --Return in about 3 months (around 06/28/2022). or sooner if needed. --Continue follow-up with your primary care physician regarding the management of your other chronic comorbid conditions.  Patient's questions and concerns were addressed to his satisfaction. He voices understanding of the instructions provided during this encounter.   This note was created using a voice recognition software as a result there may be grammatical errors inadvertently enclosed that do not reflect the nature of this encounter. Every attempt is made to correct such errors.  Rex Kras, [place], [facility]  Pager:  [phone] Office: [phone]

## 2022-03-29 ENCOUNTER — Other Ambulatory Visit: Payer: Self-pay

## 2022-03-29 DIAGNOSIS — I5022 Chronic systolic (congestive) heart failure: Secondary | ICD-10-CM

## 2022-04-08 ENCOUNTER — Other Ambulatory Visit: Payer: Self-pay | Admitting: Cardiology

## 2022-04-08 DIAGNOSIS — I5022 Chronic systolic (congestive) heart failure: Secondary | ICD-10-CM

## 2022-04-22 ENCOUNTER — Other Ambulatory Visit: Payer: 59

## 2022-04-29 ENCOUNTER — Ambulatory Visit: Payer: 59

## 2022-04-29 DIAGNOSIS — I5022 Chronic systolic (congestive) heart failure: Secondary | ICD-10-CM

## 2022-04-29 DIAGNOSIS — I429 Cardiomyopathy, unspecified: Secondary | ICD-10-CM

## 2022-04-30 ENCOUNTER — Ambulatory Visit: Payer: 59 | Admitting: Cardiology

## 2022-05-01 ENCOUNTER — Other Ambulatory Visit: Payer: 59

## 2022-05-04 ENCOUNTER — Other Ambulatory Visit: Payer: Self-pay | Admitting: Cardiology

## 2022-05-04 DIAGNOSIS — I4892 Unspecified atrial flutter: Secondary | ICD-10-CM

## 2022-05-13 ENCOUNTER — Other Ambulatory Visit: Payer: Self-pay | Admitting: Cardiology

## 2022-05-13 DIAGNOSIS — I5022 Chronic systolic (congestive) heart failure: Secondary | ICD-10-CM

## 2022-05-24 ENCOUNTER — Other Ambulatory Visit: Payer: Self-pay | Admitting: Cardiology

## 2022-05-24 DIAGNOSIS — I5022 Chronic systolic (congestive) heart failure: Secondary | ICD-10-CM

## 2022-05-25 ENCOUNTER — Other Ambulatory Visit: Payer: Self-pay | Admitting: Cardiology

## 2022-05-25 DIAGNOSIS — I4892 Unspecified atrial flutter: Secondary | ICD-10-CM

## 2022-05-25 DIAGNOSIS — I5022 Chronic systolic (congestive) heart failure: Secondary | ICD-10-CM

## 2022-05-29 ENCOUNTER — Other Ambulatory Visit: Payer: Self-pay | Admitting: Cardiology

## 2022-05-29 ENCOUNTER — Other Ambulatory Visit: Payer: Self-pay

## 2022-05-29 DIAGNOSIS — I4892 Unspecified atrial flutter: Secondary | ICD-10-CM

## 2022-05-29 DIAGNOSIS — I5022 Chronic systolic (congestive) heart failure: Secondary | ICD-10-CM

## 2022-05-29 MED ORDER — ENTRESTO 24-26 MG PO TABS
1.0000 | ORAL_TABLET | Freq: Two times a day (BID) | ORAL | 1 refills | Status: DC
Start: 2022-05-29 — End: 2022-08-14

## 2022-05-29 MED ORDER — AMIODARONE HCL 200 MG PO TABS
200.0000 mg | ORAL_TABLET | Freq: Every day | ORAL | 0 refills | Status: DC
Start: 2022-05-29 — End: 2022-05-31

## 2022-05-29 MED ORDER — POTASSIUM CHLORIDE ER 10 MEQ PO TBCR: 10.0000 meq | EXTENDED_RELEASE_TABLET | Freq: Every day | ORAL | 1 refills | Status: DC

## 2022-05-30 ENCOUNTER — Other Ambulatory Visit: Payer: 59

## 2022-05-31 NOTE — Addendum Note (Signed)
Addended by: Durward Mallard on: 05/31/2022 06:55 PM   Modules accepted: Orders

## 2022-06-03 ENCOUNTER — Ambulatory Visit: Payer: 59 | Admitting: Cardiology

## 2022-06-11 ENCOUNTER — Other Ambulatory Visit: Payer: Self-pay | Admitting: Cardiology

## 2022-06-11 DIAGNOSIS — I5022 Chronic systolic (congestive) heart failure: Secondary | ICD-10-CM

## 2022-06-24 ENCOUNTER — Ambulatory Visit: Payer: 59 | Admitting: Cardiology

## 2022-06-26 ENCOUNTER — Other Ambulatory Visit: Payer: Self-pay | Admitting: Cardiology

## 2022-06-26 DIAGNOSIS — I4892 Unspecified atrial flutter: Secondary | ICD-10-CM

## 2022-06-27 NOTE — Telephone Encounter (Signed)
refill or no?

## 2022-06-28 ENCOUNTER — Other Ambulatory Visit: Payer: Self-pay | Admitting: Cardiology

## 2022-06-28 ENCOUNTER — Ambulatory Visit: Payer: 59 | Admitting: Cardiology

## 2022-06-28 DIAGNOSIS — I4892 Unspecified atrial flutter: Secondary | ICD-10-CM

## 2022-07-10 ENCOUNTER — Other Ambulatory Visit: Payer: Self-pay | Admitting: Cardiology

## 2022-07-10 DIAGNOSIS — I5022 Chronic systolic (congestive) heart failure: Secondary | ICD-10-CM

## 2022-07-12 ENCOUNTER — Other Ambulatory Visit: Payer: Self-pay

## 2022-07-12 ENCOUNTER — Telehealth: Payer: Self-pay

## 2022-07-12 DIAGNOSIS — I5022 Chronic systolic (congestive) heart failure: Secondary | ICD-10-CM

## 2022-07-12 MED ORDER — DAPAGLIFLOZIN PROPANEDIOL 10 MG PO TABS
10.0000 mg | ORAL_TABLET | Freq: Every day | ORAL | 0 refills | Status: DC
Start: 2022-07-12 — End: 2022-07-29

## 2022-07-12 NOTE — Telephone Encounter (Signed)
Patient called and asked for a call back regarding meds, I called back but there was n/a

## 2022-07-15 ENCOUNTER — Ambulatory Visit: Payer: 59 | Admitting: Cardiology

## 2022-07-23 ENCOUNTER — Other Ambulatory Visit: Payer: 59

## 2022-07-27 ENCOUNTER — Other Ambulatory Visit: Payer: Self-pay | Admitting: Cardiology

## 2022-07-27 DIAGNOSIS — I4892 Unspecified atrial flutter: Secondary | ICD-10-CM

## 2022-07-27 DIAGNOSIS — I5022 Chronic systolic (congestive) heart failure: Secondary | ICD-10-CM

## 2022-07-29 ENCOUNTER — Telehealth: Payer: Self-pay

## 2022-07-29 ENCOUNTER — Other Ambulatory Visit: Payer: Self-pay | Admitting: Cardiology

## 2022-07-29 DIAGNOSIS — I4892 Unspecified atrial flutter: Secondary | ICD-10-CM

## 2022-07-29 NOTE — Telephone Encounter (Signed)
Pt requesting rf on amiodarone and farxiga which we are given enough only to next appt (which I explained) but he keeps canceling due to transportation issues. Also would like to know if ST can see him on same day as echo due to transportation

## 2022-07-30 NOTE — Telephone Encounter (Signed)
 Please refill.

## 2022-08-09 ENCOUNTER — Ambulatory Visit: Payer: 59

## 2022-08-09 DIAGNOSIS — I5022 Chronic systolic (congestive) heart failure: Secondary | ICD-10-CM

## 2022-08-12 ENCOUNTER — Ambulatory Visit: Payer: 59 | Admitting: Cardiology

## 2022-08-14 ENCOUNTER — Ambulatory Visit: Payer: 59 | Admitting: Cardiology

## 2022-08-14 ENCOUNTER — Encounter: Payer: Self-pay | Admitting: Cardiology

## 2022-08-14 ENCOUNTER — Other Ambulatory Visit: Payer: Self-pay | Admitting: Cardiology

## 2022-08-14 VITALS — BP 116/77 | HR 68 | Ht 72.0 in | Wt 243.0 lb

## 2022-08-14 DIAGNOSIS — I10 Essential (primary) hypertension: Secondary | ICD-10-CM

## 2022-08-14 DIAGNOSIS — R9439 Abnormal result of other cardiovascular function study: Secondary | ICD-10-CM

## 2022-08-14 DIAGNOSIS — I5032 Chronic diastolic (congestive) heart failure: Secondary | ICD-10-CM

## 2022-08-14 DIAGNOSIS — C8330 Diffuse large B-cell lymphoma, unspecified site: Secondary | ICD-10-CM

## 2022-08-14 DIAGNOSIS — Z79899 Other long term (current) drug therapy: Secondary | ICD-10-CM

## 2022-08-14 DIAGNOSIS — E782 Mixed hyperlipidemia: Secondary | ICD-10-CM

## 2022-08-14 DIAGNOSIS — Z7901 Long term (current) use of anticoagulants: Secondary | ICD-10-CM

## 2022-08-14 DIAGNOSIS — I429 Cardiomyopathy, unspecified: Secondary | ICD-10-CM

## 2022-08-14 DIAGNOSIS — I4892 Unspecified atrial flutter: Secondary | ICD-10-CM

## 2022-08-14 MED ORDER — AMIODARONE HCL 200 MG PO TABS
100.0000 mg | ORAL_TABLET | Freq: Every day | ORAL | 0 refills | Status: DC
Start: 2022-08-14 — End: 2022-09-18

## 2022-08-14 MED ORDER — FUROSEMIDE 20 MG PO TABS
20.0000 mg | ORAL_TABLET | ORAL | 0 refills | Status: DC | PRN
Start: 2022-08-14 — End: 2022-08-14

## 2022-08-14 NOTE — Progress Notes (Signed)
ID:  Terry Beltran, DOB [birth year], MRN [id number]  PCP:  Kathryne Eriksson, FNP  Cardiologist:  Tessa Lerner, DO, [facility] (established care 02/18/2022)  Date: 08/14/22 Last Office Visit: 03/28/2022  Chief Complaint  Patient presents with   Congestive Heart Failure   Follow-up    3 mo    HPI  Terry Beltran is a 67 y.o. African-American male whose past medical history and cardiovascular risk factors include: Cardiomyopathy, heart failure with improved EF, chronic HFpEF, paroxysmal atrial flutter, history of hepatitis C, primary hypertension, hypercholesterolemia, history of non-Hodgkin's lymphoma status post chemotherapy in 2016 and in remission .   In February 2024 he was admitted to the hospital with new onset of heart failure LVEF was noted to be 25%.  Rhythm was noted to be atrial flutter with rapid ventricular rate.  He was diuresed with parenteral medications and slowly started on GDMT.  His discharge weight was 265 pounds.  With regards to atrial flutter patient did undergo TEE guided cardioversion on 02/20/2022 in the setting of acute decompensated heart failure.  With the initiation of AV nodal blocking agents and antiarrhythmic medications he has remained in sinus rhythm.  At the last office visit the shared decision was to reevaluate his LVEF by doing an echocardiogram and also a nuclear stress test to evaluate for reversible ischemia given the new diagnosis of flutter and cardiomyopathy.  His nuclear stress test noted reversible perfusion defect.  He was made aware of these findings but did not want to come in sooner for reevaluation.  His echocardiogram notes improvement in LVEF from 25% to 45-50% but noted to have regional wall motion abnormalities.  He is here for follow-up.  He denies chest pain or heart failure symptoms.  Requesting refill on amiodarone.  Results of the echo and nuclear stress test reviewed with the patient at today's office visit and noted below for  further reference.  ALLERGIES: No Known Allergies  MEDICATION LIST PRIOR TO VISIT: Current Meds  Medication Sig   acetaminophen (TYLENOL) 650 MG CR tablet Take 650-1,300 mg by mouth every 8 (eight) hours as needed for pain.   ELIQUIS 5 MG TABS tablet TAKE 1 TABLET(5 MG) BY MOUTH TWICE DAILY   FARXIGA 10 MG TABS tablet TAKE 1 TABLET(10 MG) BY MOUTH DAILY   gabapentin (NEURONTIN) 300 MG capsule Take 300 mg by mouth at bedtime as needed (pain).   magnesium oxide (MAG-OX) 400 MG tablet TAKE 1 TABLET(400 MG) BY MOUTH TWICE DAILY FOR 15 DAYS (Patient taking differently: Take 200 mg by mouth daily.)   metoprolol succinate (TOPROL-XL) 25 MG 24 hr tablet Take 1 tablet (25 mg total) by mouth daily.   Multiple Vitamins-Minerals (MULTIVITAMIN WITH MINERALS) tablet Take 1 tablet by mouth daily.   potassium chloride (KLOR-CON) 10 MEQ tablet TAKE 1 TABLET(10 MEQ) BY MOUTH DAILY   sacubitril-valsartan (ENTRESTO) 24-26 MG TAKE 1 TABLET BY MOUTH TWICE DAILY   [DISCONTINUED] amiodarone (PACERONE) 200 MG tablet TAKE 1 TABLET(200 MG) BY MOUTH DAILY     PAST MEDICAL HISTORY: Past Medical History:  Diagnosis Date   Cancer (HCC)    Hypertension     PAST SURGICAL HISTORY: Past Surgical History:  Procedure Laterality Date   CARDIOVERSION N/A 02/20/2022   Procedure: CARDIOVERSION;  Surgeon: Tessa Lerner, DO;  Location: [facility];  Service: Cardiovascular;  Laterality: N/A;   TEE WITHOUT CARDIOVERSION N/A 02/20/2022   Procedure: TRANSESOPHAGEAL ECHOCARDIOGRAM (TEE);  Surgeon: Tessa Lerner, DO;  Location: [facility];  Service: Cardiovascular;  Laterality:  N/A;    FAMILY HISTORY: The patient family history includes Arthritis/Rheumatoid in his mother; Dementia in his sister; Hypertension in his father.  SOCIAL HISTORY:  The patient  reports that he quit smoking about 5 months ago. His smoking use included cigarettes. He started smoking about 20 years ago. He has a 10 pack-year smoking history. He has never  used smokeless tobacco. He reports that he does not currently use alcohol. He reports current drug use. Frequency: 4.00 times per week. Drug: Marijuana.  REVIEW OF SYSTEMS: Review of Systems  Cardiovascular:  Negative for chest pain, claudication, dyspnea on exertion, irregular heartbeat, leg swelling, near-syncope, orthopnea, palpitations, paroxysmal nocturnal dyspnea and syncope.  Respiratory:  Positive for snoring. Negative for shortness of breath.   Hematologic/Lymphatic: Negative for bleeding problem.  Musculoskeletal:  Negative for muscle cramps and myalgias.  Neurological:  Negative for dizziness and light-headedness.    PHYSICAL EXAM:    08/14/2022   10:00 AM 03/28/2022    9:21 AM 02/28/2022    9:54 AM  Vitals with BMI  Height 6\' 0"  5\' 11"  5\' 11"   Weight 243 lbs 248 lbs 254 lbs 6 oz  BMI 32.95 34.6 35.5  Systolic 116 117 409  Diastolic 77 80 74  Pulse 68 88 51    Physical Exam  Constitutional: No distress.  Age appropriate, hemodynamically stable.   Neck: No JVD present.  Cardiovascular: Normal rate, regular rhythm, S1 normal, S2 normal, intact distal pulses and normal pulses. Exam reveals no gallop, no S3 and no S4.  Murmur heard. Holosystolic murmur is present with a grade of 3/6 at the lower left sternal border. Pulmonary/Chest: Effort normal and breath sounds normal. No stridor. He has no wheezes. He has no rales.  Abdominal: Soft. Bowel sounds are normal. He exhibits no distension. There is no abdominal tenderness.  Musculoskeletal:        General: No edema.     Cervical back: Neck supple.  Neurological: He is alert and oriented to person, place, and time. He has intact cranial nerves (2-12).  Skin: Skin is warm and moist.    RADIOLOGY: CT angiogram chest PE protocol 02/17/2022: 1. No evidence of pulmonary embolism.  2. Small bilateral pleural effusions, right greater than left with associated bibasilar atelectasis.  3. Biapical pleural/parenchymal scarring with  large subpleural and centrilobular emphysematous bulla.  4. Heart is enlarged. Reflux of contrast into the hepatic veins suggesting right heart failure.  5. No evidence of pneumonia or pulmonary edema.  6. 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 No acute osseous abnormality.  7. Prominent left adrenal gland suggesting adrenal hyperplasia. 8. 8. Biapical pleural/parenchymal scarring with large subpleural and centrilobular emphysematous bulla. Small bilateral pleural effusions, right greater than the left.   DG Chest Port 1 View 02/17/2022 CLINICAL DATA:  Shortness of breath. EXAM: PORTABLE CHEST 1 VIEW COMPARISON:  CXR 02/11/21 FINDINGS: No pleural effusion. No pneumothorax. Compared to prior exam there is interval increase in size of the cardiac contours, which could suggest presence of a pericardial effusion. There are prominent bilateral interstitial opacities, suggestive of either atypical infection or pulmonary venous congestion. No radiographically apparent displaced rib fractures. No focal airspace opacity.  CARDIAC DATABASE: Transesophageal echocardiogram with direct-current cardioversion: 02/20/2022: 150 J x 1 synchronized converted to normal sinus  EKG: 02/28/2022: Atrial flutter, 58 bpm, right bundle branch block, left axis, left anterior fascicular block TWI in the lateral leads consider ischemia.  Echocardiogram: 02/18/2022 1. Left ventricular ejection fraction, by estimation, is 25%.  The left  ventricle has severely decreased function. The left ventricle demonstrates  global hypokinesis. The left ventricular internal cavity size was mildly  dilated. Left ventricular  diastolic parameters are indeterminate.  2. Right ventricular systolic function is moderately reduced. The right  ventricular size is mildly enlarged. There is mildly elevated pulmonary  artery systolic pressure. The estimated right ventricular systolic  pressure is 40.2 mmHg.  3. Left atrial size was mildly  dilated.  4. Right atrial size was moderately dilated.  5. The mitral valve is normal in structure. Mild mitral valve  regurgitation. No evidence of mitral stenosis.  6. Tricuspid valve regurgitation is moderate.  7. The aortic valve is tricuspid. There is mild calcification of the  aortic valve. Aortic valve regurgitation is trivial. No aortic stenosis is  present.  8. Aortic dilatation noted. There is mild dilatation of the aortic root,  measuring 37 mm.  9. The inferior vena cava is dilated in size with <50% respiratory  variability, suggesting right atrial pressure of 15 mmHg.    08/09/2022:  Mildly depressed LV systolic function with visual EF 45-50%. Left  ventricle cavity is minimally dilated. Normal left ventricular wall  thickness.  Doppler evidence of grade I (impaired) diastolic dysfunction, normal LAP.  Left ventricle regional wall motion findings: Basal inferolateral, Basal  inferior, Mid inferolateral and Mid inferior hypokinesis.  Left atrial cavity is moderately dilated at 44.73 ml/m^2.  Aortic valve sclerosis without stenosis.  Mild (Grade I) mitral regurgitation.  Mild tricuspid regurgitation. No evidence of pulmonary hypertension.  Compared to 02/18/2022 LVEF improved from 25% to 45-50%, RV size and  function has improved, pulmonary hypertension per RVSP is no longer  present, LA mild dilation is now moderate, Moderate TR is now mild,  estimated RAP of is now 3 mmHg, RWMA are new findings.   Stress Testing: Lexiscan Tetrofosmin stress test 04/29/2022: Lexiscan nuclear stress test performed using 1-day protocol. SPECT images show small sized, mild intensity, reversible perfusion defect in basal inferior/inferolateral myocardium. Severe global hypokinesis with stress LVEF 28%. High risk study.    Heart Catheterization: None  LABORATORY DATA:    Latest Ref Rng & Units 02/20/2022   12:55 AM 02/18/2022   12:28 AM 02/17/2022   11:13 AM  CBC  WBC 4.0 - 10.5  K/uL 9.3  9.7  8.7   Hemoglobin 13.0 - 17.0 g/dL 14.7  82.9  56.2   Hematocrit 39.0 - 52.0 % 41.5  39.6  37.7   Platelets 150 - 400 K/uL 261  243  248        Latest Ref Rng & Units 03/15/2022    9:52 AM 02/21/2022   12:43 AM 02/20/2022   12:55 AM  CMP  Glucose 70 - 99 mg/dL 94  130  865   BUN 8 - 27 mg/dL 26  17  19    Creatinine 0.76 - 1.27 mg/dL 7.84  6.96  2.95   Sodium 134 - 144 mmol/L 139  135  137   Potassium 3.5 - 5.2 mmol/L 4.9  3.8  4.1   Chloride 96 - 106 mmol/L 99  99  101   CO2 20 - 29 mmol/L 21  28  27    Calcium 8.6 - 10.2 mg/dL 28.4  8.7  9.1     Lipid Panel     Component Value Date/Time   CHOL 119 02/19/2022 0040   TRIG 86 02/19/2022 0040   HDL 29 (L) 02/19/2022 0040   CHOLHDL 4.1 02/19/2022 0040  VLDL 17 02/19/2022 0040   LDLCALC 73 02/19/2022 0040    No components found for: "NTPROBNP" Recent Labs    03/15/22 0952  PROBNP 541*   Recent Labs    02/17/22 1114 02/19/22 0040  TSH 1.591 2.234    BMP Recent Labs    02/19/22 0040 02/20/22 0055 02/21/22 0043 03/15/22 0952  NA 138 137 135 139  K 3.4* 4.1 3.8 4.9  CL 100 101 99 99  CO2 26 27 28 21   GLUCOSE 101* 110* 112* 94  BUN 15 19 17 26   CREATININE 1.35* 1.61* 1.46* 1.66*  CALCIUM 9.2 9.1 8.7* 10.6*  GFRNONAA 57* 46* 52*  --     HEMOGLOBIN A1C Lab Results  Component Value Date   HGBA1C 5.8 (H) 02/19/2022   MPG 119.76 02/19/2022    IMPRESSION:    ICD-10-CM   1. Chronic heart failure with preserved ejection fraction (HCC)  I50.32 CMP14+EGFR    TSH    Pro b natriuretic peptide (BNP)    DISCONTINUED: furosemide (LASIX) 20 MG tablet    2. Heart failure with improved ejection fraction (HFimpEF) (HCC)  I50.32     3. Cardiomyopathy, unspecified type (HCC)  I42.9     4. Abnormal nuclear stress test  R94.39     5. Paroxysmal atrial flutter (HCC)  I48.92 amiodarone (PACERONE) 200 MG tablet    6. Long term current use of antiarrhythmic drug  Z79.899 amiodarone (PACERONE) 200 MG tablet     TSH    DG Chest 2 View    Pulmonary function test    7. Current use of long term anticoagulation  Z79.01     8. Benign hypertension  I10     9. Mixed hyperlipidemia  E78.2     10. Diffuse large B-cell lymphoma, unspecified body region (HCC)  C83.30     11. Chronic HFrEF (heart failure with reduced ejection fraction) (HCC)  I50.22     12. Atrial flutter with controlled response [facility])  I48.92         RECOMMENDATIONS: Phinn Stauff is a 67 y.o. African-American male whose past medical history and cardiac risk factors include: Cardiomyopathy, HFpEF, paroxysmal atrial flutter, history of hepatitis C, primary hypertension, hypercholesterolemia, history of non-Hodgkin's lymphoma status post chemotherapy in 2016 and in remission .   Chronic heart failure with preserved ejection fraction (HCC) Heart failure with improved ejection fraction (HFimpEF) (HCC) Cardiomyopathy, unspecified type (HCC) Diagnosed with heart failure with reduced EF in February 2024 during his hospitalization his LVEF was 25%. With uptitration of GDMT and restoring sinus rhythm most recent echocardiogram revealed an LVEF of 45-50%. Stage C, NYHA class II. Discharge weight 265 pounds, today's weight is 243 pounds. Most recent labs independently reviewed and noted above. Medications reconciled.   Will change Lasix to as needed basis. Continue current medical therapy  Abnormal nuclear stress test Given the new onset of heart failure and atrial flutter shared decision at the prior office visit was to proceed with stress test to evaluate for ischemic substrate. Prior EKG noted lateral TWI.  Recent MPI notes reversible perfusion defect (inferior / inferolateral) and  reduced LVEF by gated SPECT and overall considered to be high risk study. Echocardiogram notes improvement in LVEF but also illustrates regional wall motion abnormalities. I had discussed these results with him over the phone and at that time he had  refused to undergo angiography to rule out obstructive disease. I discussed with him the risks, benefits, alternatives, indications and limitations of left  heart catheterization.  Patient states that he would like to hold off as he is not having anginal chest pain and that he may consider second opinion.  I strongly encouraged him to have a second opinion given these findings and comorbid conditions.  Patient states that he has an appointment with Dr. Beverely Pace at [facility] health in October 2024. He would like to hold off on the left heart catheterization with possible intervention for now will call us back if he would like to proceed forward  The procedure of left heart catheterization with possible intervention was explained to the patient  in detail.  The indication, alternatives, risks and benefits were reviewed.  Complications include but not limited to bleeding, infection, vascular injury, stroke, myocardial infarction, arrhythmia (requiring medical or cardiopulmonary resuscitation), kidney injury (requiring short-term or long-term hemodialysis), radiation-related injury in the case of prolonged fluoroscopy use, emergent cardiac surgery, temporary or permanent pacemaker, and death. The patient  understands the risks of serious complication is 1-2 in 1000 with diagnostic cardiac cath and 1-2% or less with angioplasty/stenting.    The patient voices understanding and provides verbal feedback his questions and concerns are addressed to his satisfaction and patient wishes to proceed with coronary angiography with possible PCI.   Educated on seeking medical attention sooner by going to the closest ER via EMS if the symptoms increase in intensity, frequency, duration, or has typical chest pain as discussed in the office.  Patient verbalized understanding.  Paroxysmal atrial flutter (HCC) Long term current use of antiarrhythmic drug Rate control: Metoprolol. Rhythm control: Amiodarone. Thromboembolic  prophylaxis: Eliquis. Underwent TEE guided cardioversion on February 20, 2022 150 J x 1. In the past had referred him to EP for catheter directed atrial flutter ablation-still pending. Will reduce amiodarone dose to 100 mg p.o. daily. With regards to drug monitoring will check CMP, TSH, chest x-ray, PFT.  He is also advised to have a annual eye exam and to inform his provider that he is on amiodarone.  Current use of long term anticoagulation Indication: Paroxysmal atrial flutter. CHA2DS2-VASc SCORE is 3 which correlates to 3.2% risk of stroke per year (CHF, hypertension, age).   Benign hypertension Office blood pressures are well-controlled. Medications reconciled. No changes warranted at this time  FINAL MEDICATION LIST END OF ENCOUNTER: Meds ordered this encounter  Medications   DISCONTD: furosemide (LASIX) 20 MG tablet    Sig: Take 1 tablet (20 mg total) by mouth as needed (If weight goes up by 1 pound over 24hr or 3 pounds over a week.).    Dispense:  30 tablet    Refill:  0   amiodarone (PACERONE) 200 MG tablet    Sig: Take 0.5 tablets (100 mg total) by mouth daily.    Dispense:  90 tablet    Refill:  0    **Patient requests 90 days supply**    Medications Discontinued During This Encounter  Medication Reason   potassium chloride (KLOR-CON) 10 MEQ tablet    sacubitril-valsartan (ENTRESTO) 24-26 MG    meloxicam (MOBIC) 15 MG tablet Patient Preference   furosemide (LASIX) 20 MG tablet    amiodarone (PACERONE) 200 MG tablet Reorder     Current Outpatient Medications:    acetaminophen (TYLENOL) 650 MG CR tablet, Take 650-1,300 mg by mouth every 8 (eight) hours as needed for pain., Disp: , Rfl:    ELIQUIS 5 MG TABS tablet, TAKE 1 TABLET(5 MG) BY MOUTH TWICE DAILY, Disp: 60 tablet, Rfl: 0   FARXIGA 10 MG  TABS tablet, TAKE 1 TABLET(10 MG) BY MOUTH DAILY, Disp: 30 tablet, Rfl: 3   gabapentin (NEURONTIN) 300 MG capsule, Take 300 mg by mouth at bedtime as needed (pain)., Disp: ,  Rfl:    magnesium oxide (MAG-OX) 400 MG tablet, TAKE 1 TABLET(400 MG) BY MOUTH TWICE DAILY FOR 15 DAYS (Patient taking differently: Take 200 mg by mouth daily.), Disp: 30 tablet, Rfl: 0   metoprolol succinate (TOPROL-XL) 25 MG 24 hr tablet, Take 1 tablet (25 mg total) by mouth daily., Disp: 30 tablet, Rfl: 0   Multiple Vitamins-Minerals (MULTIVITAMIN WITH MINERALS) tablet, Take 1 tablet by mouth daily., Disp: , Rfl:    potassium chloride (KLOR-CON) 10 MEQ tablet, TAKE 1 TABLET(10 MEQ) BY MOUTH DAILY, Disp: 90 tablet, Rfl: 0   sacubitril-valsartan (ENTRESTO) 24-26 MG, TAKE 1 TABLET BY MOUTH TWICE DAILY, Disp: 60 tablet, Rfl: 0   amiodarone (PACERONE) 200 MG tablet, Take 0.5 tablets (100 mg total) by mouth daily., Disp: 90 tablet, Rfl: 0   furosemide (LASIX) 20 MG tablet, TAKE 1 TABLET BY MOUTH AS NEEDED FOR WEIGHT GOES UP BY 1 POUND OVER 24 HOURS OR 3 POUNDS OVER A WEEK, Disp: 90 tablet, Rfl: 0  Orders Placed This Encounter  Procedures   DG Chest 2 View   CMP14+EGFR   TSH   Pro b natriuretic peptide (BNP)   Pulmonary function test    There are no Patient Instructions on file for this visit.   --Continue cardiac medications as reconciled in final medication list. --Return in about 3 months (around 11/14/2022) for Follow up HFpEF, abnormal nuclear stress. . or sooner if needed. --Continue follow-up with your primary care physician regarding the management of your other chronic comorbid conditions.  Patient's questions and concerns were addressed to his satisfaction. He voices understanding of the instructions provided during this encounter.   This note was created using a voice recognition software as a result there may be grammatical errors inadvertently enclosed that do not reflect the nature of this encounter. Every attempt is made to correct such errors.  Tessa Lerner, [place], [facility]  Pager:  [phone] Office: [phone]

## 2022-09-06 ENCOUNTER — Other Ambulatory Visit: Payer: Self-pay | Admitting: Cardiology

## 2022-09-06 DIAGNOSIS — I4892 Unspecified atrial flutter: Secondary | ICD-10-CM

## 2022-09-07 ENCOUNTER — Other Ambulatory Visit: Payer: Self-pay | Admitting: Cardiology

## 2022-09-07 DIAGNOSIS — Z79899 Other long term (current) drug therapy: Secondary | ICD-10-CM

## 2022-09-07 DIAGNOSIS — I4892 Unspecified atrial flutter: Secondary | ICD-10-CM

## 2022-09-10 NOTE — Telephone Encounter (Signed)
Please refill.

## 2022-09-11 NOTE — Telephone Encounter (Signed)
Script until next office visit.  I renew it than.   Blayn Whetsell [place], DO, [facility]

## 2022-09-11 NOTE — Telephone Encounter (Signed)
Patient stated that he asked for a refill of Amiodarone to take him to his next appointment with you on 11/04/22. Patient stated that he is going to another cardiologist just to get a second opinion on getting a cath done, but is staying with you. Please advise.

## 2022-10-11 ENCOUNTER — Other Ambulatory Visit: Payer: Self-pay | Admitting: Cardiology

## 2022-10-11 DIAGNOSIS — I4892 Unspecified atrial flutter: Secondary | ICD-10-CM

## 2022-10-11 NOTE — Telephone Encounter (Signed)
Prescription refill request for Eliquis received.  Indication: afib  Last office visit: [place], 08/14/2022 Scr: 1.46, 282024 Age: 67 yo  Weight: 110.2 kg   Refill sent.

## 2022-11-04 ENCOUNTER — Ambulatory Visit: Payer: Self-pay | Admitting: Cardiology

## 2022-11-22 ENCOUNTER — Other Ambulatory Visit: Payer: Self-pay | Admitting: Cardiology

## 2022-11-22 DIAGNOSIS — I5022 Chronic systolic (congestive) heart failure: Secondary | ICD-10-CM

## 2022-11-29 ENCOUNTER — Other Ambulatory Visit: Payer: Self-pay | Admitting: Cardiology

## 2022-11-29 DIAGNOSIS — I5022 Chronic systolic (congestive) heart failure: Secondary | ICD-10-CM

## 2023-04-08 IMAGING — CT CT ABD-PELV W/ CM
2 of 5 series · 16 of 46 positions shown, 18 images · IV contrast (Omnipaque)
Comparison: 03/11/2019.

CLINICAL DATA: Left abdominal pressure for 1 week with nausea and
vomiting. Decreased appetite. Short of breath with ambulation.

EXAM:
CT ABDOMEN AND PELVIS WITH CONTRAST
TECHNIQUE: Multidetector CT imaging of the abdomen and pelvis was performed
using the standard protocol following bolus administration of
intravenous contrast.

[Series 2: axial st · axial · 0.98mm/px · z∈[-488,-48]mm · 13 of 98 slices shown, 15 images]
[im 5/98  soft-tissue]
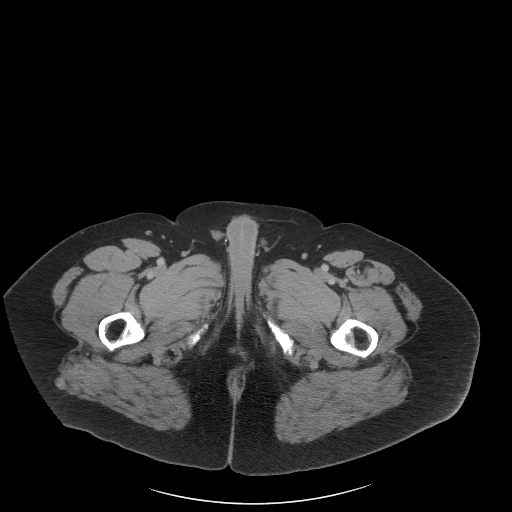
[im 5/98  bone]
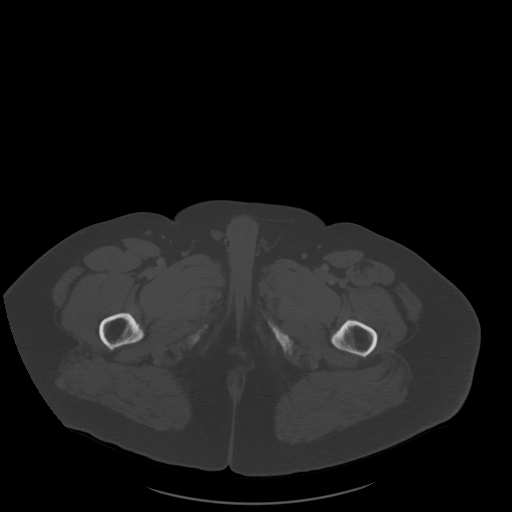
[im 15/98  soft-tissue]
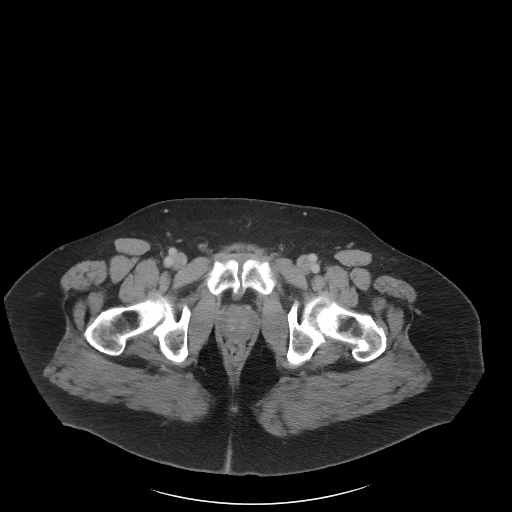
[im 20/98  soft-tissue]
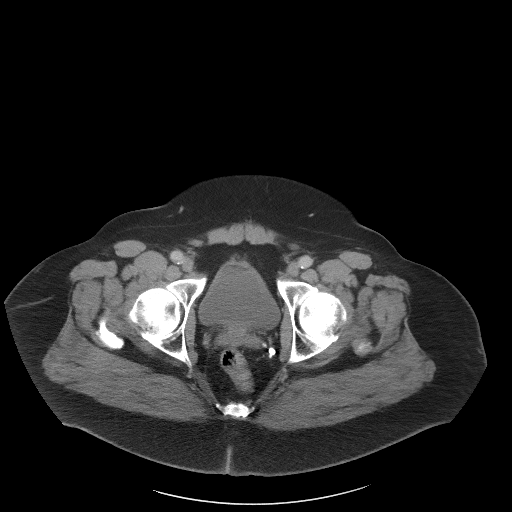
[im 30/98  soft-tissue]
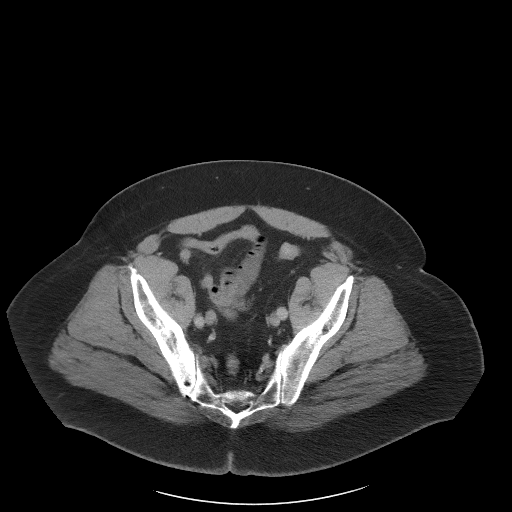
[im 34/98  soft-tissue]
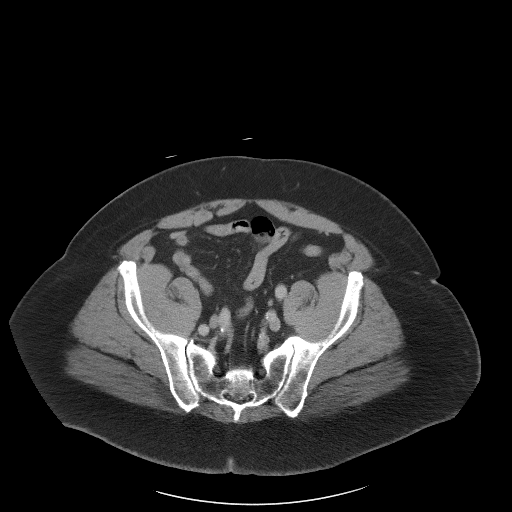
[im 44/98  soft-tissue]
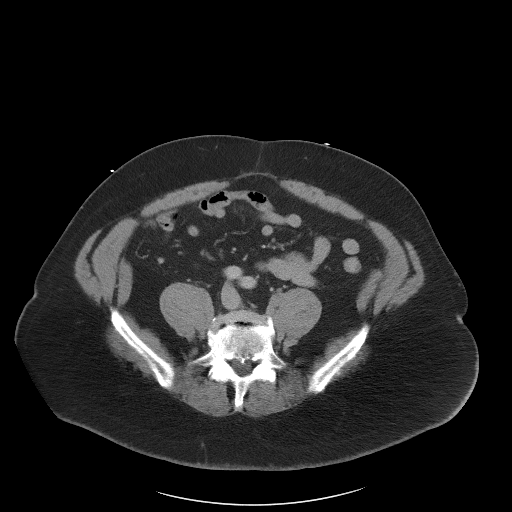
[im 49/98  soft-tissue]
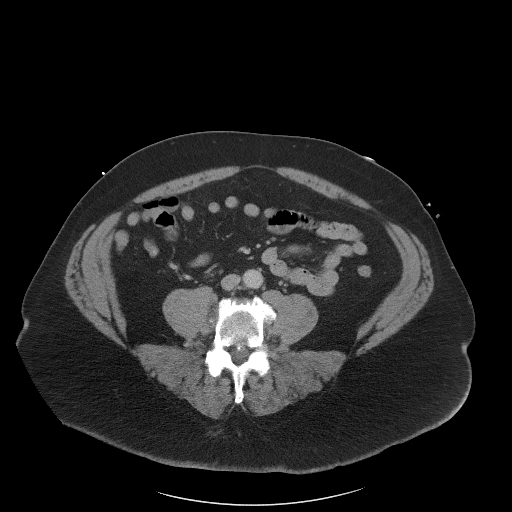
[im 54/98  soft-tissue]
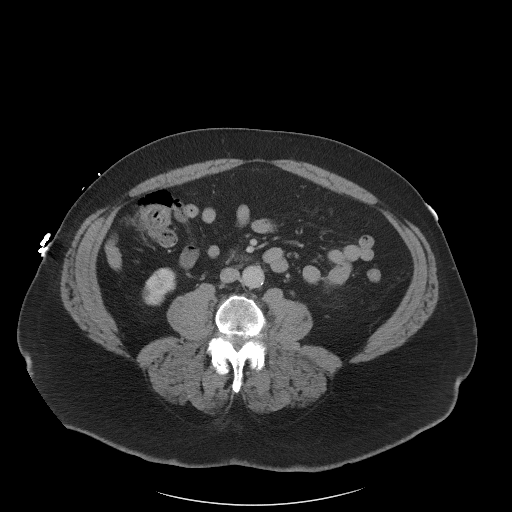
[im 64/98  soft-tissue]
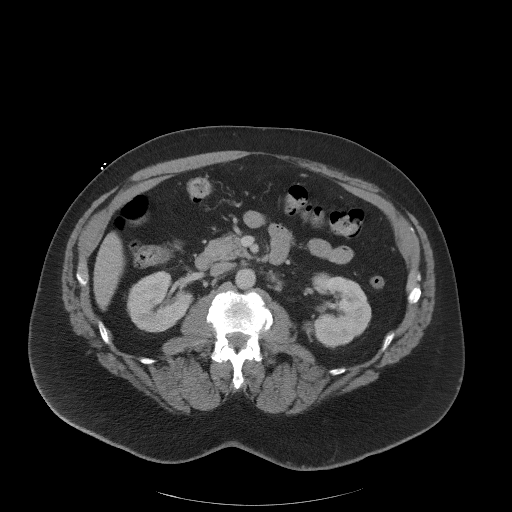
[im 64/98  bone]
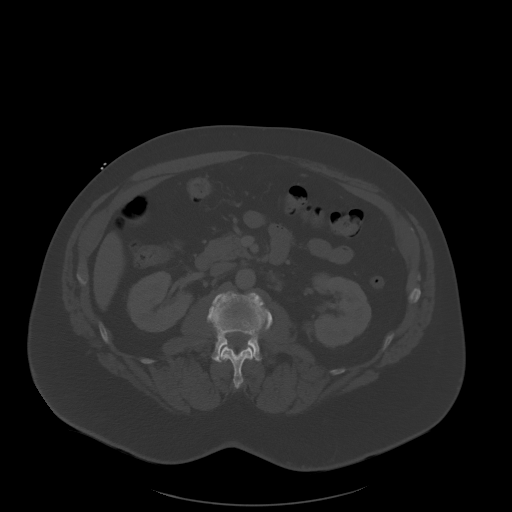
[im 68/98  soft-tissue]
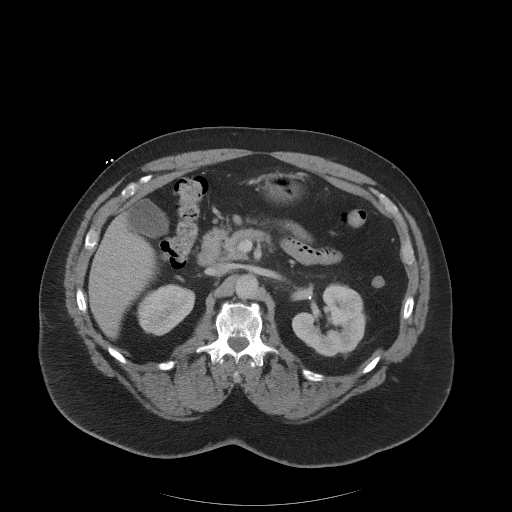
[im 78/98  soft-tissue]
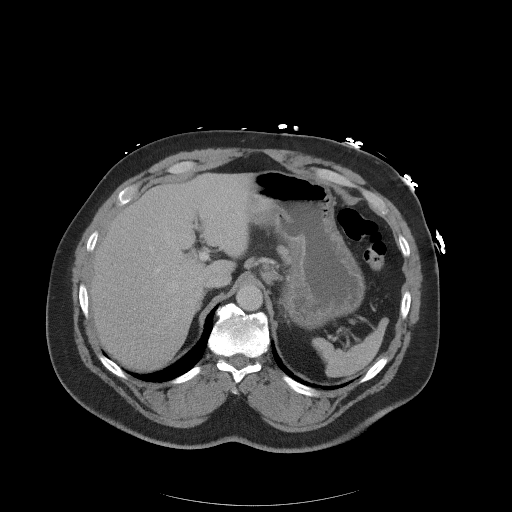
[im 83/98  soft-tissue]
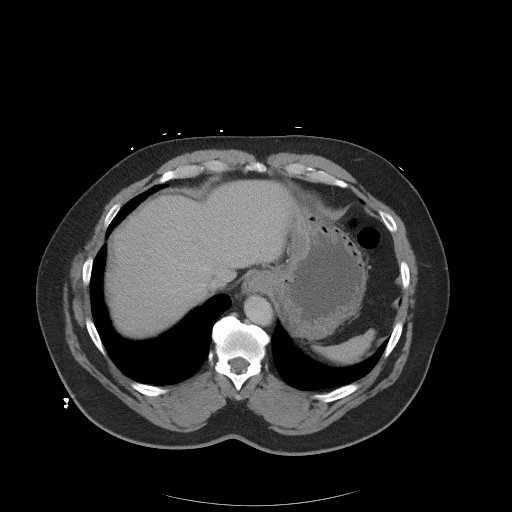
[im 93/98  soft-tissue]
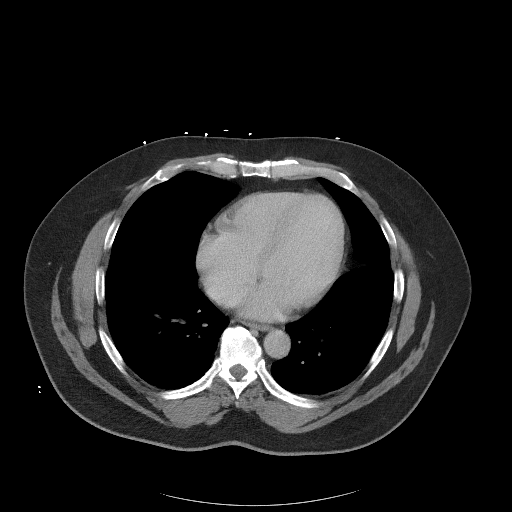

[Series 5: coronal st · coronal · 0.87mm/px · 3 of 115 slices shown]
[im 39/115  soft-tissue]
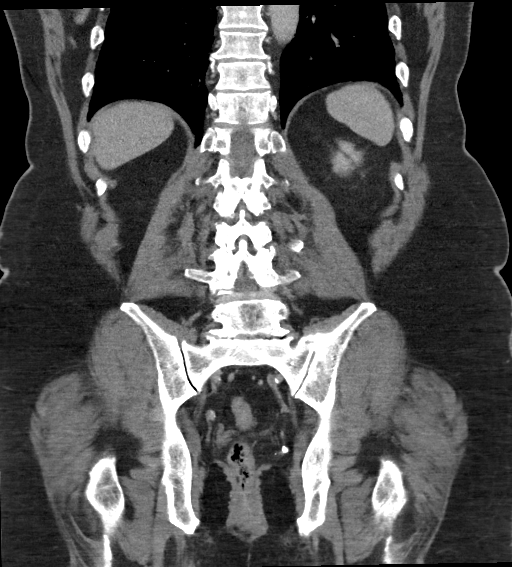
[im 51/115  soft-tissue]
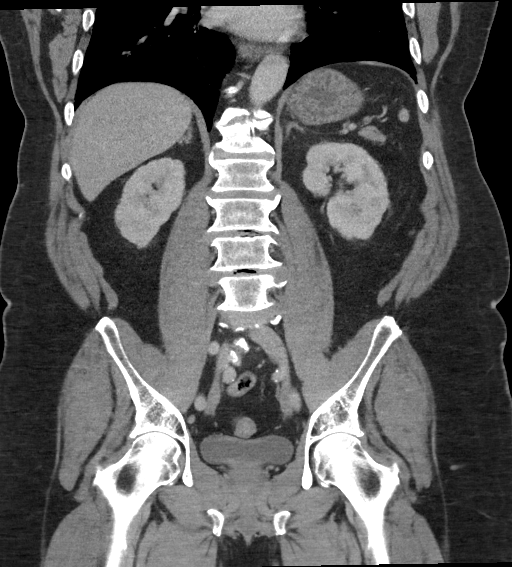
[im 64/115  soft-tissue]
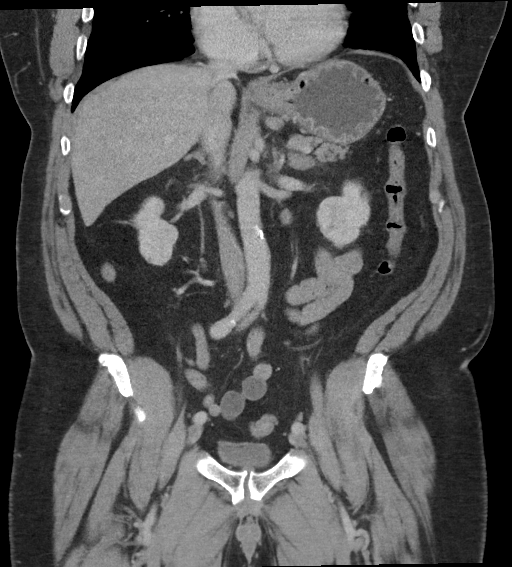

[16 of 46 positions shown; findings below may reference images not displayed]

RADIATION DOSE REDUCTION: This exam was performed according to the
departmental dose-optimization program which includes automated
exposure control, adjustment of the mA and/or kV according to
patient size and/or use of iterative reconstruction technique.

CONTRAST:  100mL OMNIPAQUE IOHEXOL 300 MG/ML  SOLN
FINDINGS: Lower chest: No acute abnormality.

Hepatobiliary: No focal liver abnormality is seen. No gallstones,
gallbladder wall thickening, or biliary dilatation.

Pancreas: Unremarkable. No pancreatic ductal dilatation or
surrounding inflammatory changes.

Spleen: Normal in size without focal abnormality.

Adrenals/Urinary Tract: Mild stable nodular thickening of the left
adrenal gland consistent with hyperplasia similar to the prior CT.
Normal right adrenal gland.

Kidneys normal size, orientation and position with symmetric
enhancement and excretion. Subcentimeter low-density renal lesions,
lateral cortex, lower pole of the right kidney and posterior upper
pole the left kidney, stable consistent with cysts. No other masses,
no stones and no hydronephrosis. Normal ureters. Normal bladder.

Stomach/Bowel: Apparent thickening of the wall of the stomach, along
its posterior margin and lesser sac. Stomach otherwise unremarkable.

Colon and small bowel are normal in caliber. No wall thickening. No
inflammation. Normal appendix visualized.

Vascular/Lymphatic: Prominent periceliac lymph nodes, largest 1.1 cm
short axis. No other adenopathy. Minor aortic atherosclerosis. No
aneurysm.

Reproductive: Unremarkable.

Other: Minimal fat containing umbilical hernia.  No ascites.

Musculoskeletal: No fracture or acute finding.  No bone lesion.
IMPRESSION: 1. Apparent wall thickening along the posterior margin of the
stomach and lesser sac suggesting gastritis. This is an equivocal
finding. There are prominent periceliac lymph nodes, presumed
reactive and stable from the prior CT.
2. No other evidence of an acute abnormality within the abdomen or
pelvis. No findings to account for left lower quadrant abdominal
pain.
3. Minor aortic atherosclerosis.

## 2023-05-20 ENCOUNTER — Other Ambulatory Visit: Payer: Self-pay | Admitting: Cardiology

## 2023-05-20 DIAGNOSIS — I5022 Chronic systolic (congestive) heart failure: Secondary | ICD-10-CM

## 2023-06-11 ENCOUNTER — Other Ambulatory Visit: Payer: Self-pay | Admitting: Cardiology

## 2023-06-11 DIAGNOSIS — I5022 Chronic systolic (congestive) heart failure: Secondary | ICD-10-CM

## 2023-06-11 DIAGNOSIS — I4892 Unspecified atrial flutter: Secondary | ICD-10-CM

## 2023-06-11 DIAGNOSIS — Z79899 Other long term (current) drug therapy: Secondary | ICD-10-CM

## 2023-08-26 ENCOUNTER — Telehealth: Payer: Self-pay | Admitting: Cardiology

## 2023-08-26 NOTE — Telephone Encounter (Signed)
 Spoke with pt regarding Eliquis . Pt stated he is unable to afford Eliquis  as his Medicare will not start until September. Pt has not been seen in our office for a year and does not currently have an appointment. Pt has most recently been seen by [facility] for cardiology. Pt was told to contact his cardiologist there regarding the medication. Pt verbalized understanding. All questions if any were answered.

## 2023-08-26 NOTE — Telephone Encounter (Signed)
 Patient calling the office for samples of medication:   1.  What medication and dosage are you requesting samples for? apixaban  (ELIQUIS ) 5 MG TABS tablet   2.  Are you currently out of this medication?   Patient stated he is almost out of this medication and his Medicare will not start until 9/1.

## 2023-09-19 ENCOUNTER — Other Ambulatory Visit: Payer: Self-pay | Admitting: Cardiology

## 2023-09-19 DIAGNOSIS — I5022 Chronic systolic (congestive) heart failure: Secondary | ICD-10-CM

## 2023-11-05 ENCOUNTER — Other Ambulatory Visit: Payer: Self-pay | Admitting: Cardiology

## 2023-11-05 DIAGNOSIS — I5022 Chronic systolic (congestive) heart failure: Secondary | ICD-10-CM

## 2023-11-30 ENCOUNTER — Other Ambulatory Visit: Payer: Self-pay | Admitting: Cardiology

## 2023-11-30 DIAGNOSIS — Z79899 Other long term (current) drug therapy: Secondary | ICD-10-CM

## 2023-11-30 DIAGNOSIS — I4892 Unspecified atrial flutter: Secondary | ICD-10-CM
